# Patient Record
Sex: Male | Born: 1955 | Race: Black or African American | Hispanic: No | Marital: Married | Smoking: Never smoker
Health system: Southern US, Community
[De-identification: ages and names within clinical notes are randomized; demographics above are authoritative.]

## PROBLEM LIST (undated history)

## (undated) DIAGNOSIS — N529 Male erectile dysfunction, unspecified: Secondary | ICD-10-CM

## (undated) DIAGNOSIS — M1990 Unspecified osteoarthritis, unspecified site: Secondary | ICD-10-CM

## (undated) DIAGNOSIS — Z905 Acquired absence of kidney: Secondary | ICD-10-CM

## (undated) DIAGNOSIS — E039 Hypothyroidism, unspecified: Secondary | ICD-10-CM

## (undated) DIAGNOSIS — Z8042 Family history of malignant neoplasm of prostate: Secondary | ICD-10-CM

## (undated) DIAGNOSIS — I10 Essential (primary) hypertension: Secondary | ICD-10-CM

## (undated) DIAGNOSIS — Z972 Presence of dental prosthetic device (complete) (partial): Secondary | ICD-10-CM

## (undated) DIAGNOSIS — Z803 Family history of malignant neoplasm of breast: Secondary | ICD-10-CM

## (undated) DIAGNOSIS — Z9189 Other specified personal risk factors, not elsewhere classified: Secondary | ICD-10-CM

## (undated) DIAGNOSIS — E119 Type 2 diabetes mellitus without complications: Secondary | ICD-10-CM

## (undated) DIAGNOSIS — E785 Hyperlipidemia, unspecified: Secondary | ICD-10-CM

## (undated) DIAGNOSIS — R399 Unspecified symptoms and signs involving the genitourinary system: Secondary | ICD-10-CM

## (undated) DIAGNOSIS — Z923 Personal history of irradiation: Secondary | ICD-10-CM

## (undated) DIAGNOSIS — C61 Malignant neoplasm of prostate: Secondary | ICD-10-CM

## (undated) DIAGNOSIS — Z973 Presence of spectacles and contact lenses: Secondary | ICD-10-CM

## (undated) HISTORY — DX: Family history of malignant neoplasm of breast: Z80.3

## (undated) HISTORY — DX: Family history of malignant neoplasm of prostate: Z80.42

---

## 1997-02-26 HISTORY — PX: KIDNEY DONATION: SHX685

## 2003-09-06 ENCOUNTER — Encounter: Admission: RE | Admit: 2003-09-06 | Discharge: 2003-09-06 | Payer: Self-pay | Admitting: Internal Medicine

## 2004-07-05 ENCOUNTER — Encounter: Admission: RE | Admit: 2004-07-05 | Discharge: 2004-07-05 | Payer: Self-pay | Admitting: Internal Medicine

## 2005-02-26 HISTORY — PX: KNEE ARTHROSCOPY: SUR90

## 2005-11-18 ENCOUNTER — Emergency Department (HOSPITAL_COMMUNITY): Admission: EM | Admit: 2005-11-18 | Discharge: 2005-11-18 | Payer: Self-pay | Admitting: Family Medicine

## 2011-09-24 ENCOUNTER — Encounter (HOSPITAL_COMMUNITY): Payer: Self-pay | Admitting: Emergency Medicine

## 2011-09-24 ENCOUNTER — Emergency Department (HOSPITAL_COMMUNITY): Payer: No Typology Code available for payment source

## 2011-09-24 ENCOUNTER — Emergency Department (HOSPITAL_COMMUNITY)
Admission: EM | Admit: 2011-09-24 | Discharge: 2011-09-24 | Disposition: A | Discharge status: Home / Self Care | Payer: No Typology Code available for payment source | Attending: Emergency Medicine | Admitting: Emergency Medicine

## 2011-09-24 DIAGNOSIS — Z79899 Other long term (current) drug therapy: Secondary | ICD-10-CM | POA: Insufficient documentation

## 2011-09-24 DIAGNOSIS — Y92410 Unspecified street and highway as the place of occurrence of the external cause: Secondary | ICD-10-CM | POA: Insufficient documentation

## 2011-09-24 DIAGNOSIS — M549 Dorsalgia, unspecified: Secondary | ICD-10-CM | POA: Insufficient documentation

## 2011-09-24 DIAGNOSIS — Z8739 Personal history of other diseases of the musculoskeletal system and connective tissue: Secondary | ICD-10-CM | POA: Insufficient documentation

## 2011-09-24 DIAGNOSIS — I10 Essential (primary) hypertension: Secondary | ICD-10-CM | POA: Insufficient documentation

## 2011-09-24 HISTORY — DX: Essential (primary) hypertension: I10

## 2011-09-24 HISTORY — DX: Unspecified osteoarthritis, unspecified site: M19.90

## 2011-09-24 MED ORDER — METHOCARBAMOL 500 MG PO TABS: 500.0000 mg | ORAL_TABLET | Freq: Two times a day (BID) | ORAL | Status: AC
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
## 2012-05-13 HISTORY — PX: PROSTATE BIOPSY: SHX241

## 2012-05-26 ENCOUNTER — Ambulatory Visit
Admission: RE | Admit: 2012-05-26 | Discharge: 2012-05-26 | Disposition: A | Discharge status: Home / Self Care | Payer: BC Managed Care – PPO | Source: Ambulatory Visit | Attending: Radiation Oncology | Admitting: Radiation Oncology

## 2012-05-26 ENCOUNTER — Encounter: Payer: Self-pay | Admitting: Radiation Oncology

## 2012-05-26 VITALS — BP 140/83 | HR 108 | Temp 99.1°F | Wt 232.6 lb

## 2012-05-26 DIAGNOSIS — C61 Malignant neoplasm of prostate: Secondary | ICD-10-CM | POA: Insufficient documentation

## 2012-05-26 DIAGNOSIS — G4730 Sleep apnea, unspecified: Secondary | ICD-10-CM | POA: Insufficient documentation

## 2012-05-26 DIAGNOSIS — Z7982 Long term (current) use of aspirin: Secondary | ICD-10-CM | POA: Insufficient documentation

## 2012-05-26 DIAGNOSIS — I10 Essential (primary) hypertension: Secondary | ICD-10-CM | POA: Insufficient documentation

## 2012-05-26 DIAGNOSIS — N529 Male erectile dysfunction, unspecified: Secondary | ICD-10-CM | POA: Insufficient documentation

## 2012-05-26 DIAGNOSIS — E7800 Pure hypercholesterolemia, unspecified: Secondary | ICD-10-CM | POA: Insufficient documentation

## 2012-05-26 DIAGNOSIS — E119 Type 2 diabetes mellitus without complications: Secondary | ICD-10-CM | POA: Insufficient documentation

## 2012-05-26 DIAGNOSIS — Z79899 Other long term (current) drug therapy: Secondary | ICD-10-CM | POA: Insufficient documentation

## 2012-05-26 HISTORY — DX: Male erectile dysfunction, unspecified: N52.9

## 2012-05-26 HISTORY — DX: Type 2 diabetes mellitus without complications: E11.9

## 2012-05-26 HISTORY — DX: Malignant neoplasm of prostate: C61

## 2012-05-26 NOTE — Progress Notes (Signed)
Keith Maldonado here for consultation for prostate cancer.  He denies pain and fatigue.  He has had trouble emptying his bladder 1-2 times in the past month.  He does have urinary urgency.  He denies hematuria or brining on urination.  He usually gets up 2 times a night to urinate.

## 2012-05-26 NOTE — Progress Notes (Signed)
[facility] Radiation Oncology NEW PATIENT EVALUATION  Name: Keith Maldonado MRN: [id number]  Date:   05/26/2012           DOB: Jun 26, 1955  Status: outpatient   CC: Lorenda Peck, MD  Kathi Ludwig, * , Dr. Heloise Purpura   REFERRING PHYSICIAN: Jethro Bolus I, *   DIAGNOSIS:  Stage TI C. favorable risk adenocarcinoma prostate  HISTORY OF PRESENT ILLNESS:  Keith Maldonado is a 57 y.o. male who is seen today for the courtesy of Dr. Alexis Frock for discussion of possible radiation therapy in the management of his stage TI C. favorable risk adenocarcinoma prostate. He was noted to have an elevated PSA of approximately 3.92, according to the patient, by Dr. Su Hilt late last year. He was seen by Dr. Patsi Sears and a repeat PSA on 03/12/2012 was 3.38. He underwent ultrasound-guided biopsies of the prostate on 05/13/2012. His then have Gleason 6 (3+3) involving less than 5% of one core from the right lateral mid gland, less than 5% of one core from the left lateral base all of high-grade PIN from the left lateral mid gland and left base. His gland volume is 31.58 cc. He is doing well from a GU and GI standpoint. His I PSS score is 10. He does have erectile dysfunction.  PREVIOUS RADIATION THERAPY: No   PAST MEDICAL HISTORY:  has a past medical history of Hypertension; Arthritis; Knee osteoarthritis; Sleep apnea; Diabetes mellitus, type 2; Hypercholesterolemia; Hyperthyroidism; Prostate cancer; Erectile dysfunction; and Elevated PSA (03/12/12).     PAST SURGICAL HISTORY:  Past Surgical History  Procedure Laterality Date  . Knee surgery Left 2008  . Kidney donation    . Prostate biopsy  05/13/2012    right lat mid: adenocarcinoma 3+3=6, left lat base 3+3=6     FAMILY HISTORY: family history includes Diabetes in his mother; Heart failure in his father and mother; and Kidney failure in his father. His father died from kidney failure and cardiac disease and 90.  His mother died from a cardiac event at 46. No family history of prostate cancer.   SOCIAL HISTORY:  reports that he has never smoked. He has never used smokeless tobacco. He reports that  drinks alcohol. He reports that he uses illicit drugs (Marijuana). Married, no children. He is been married for approximately 2 years to his current wife who is a Runner, broadcasting/film/video of approximately 30 years. He works for the [employer].   ALLERGIES: Review of patient's allergies indicates no known allergies.   MEDICATIONS:  Current Outpatient Prescriptions  Medication Sig Dispense Refill  . amitriptyline (ELAVIL) 25 MG tablet Take 75 mg by mouth at bedtime.      Marland Kitchen amLODipine (NORVASC) 10 MG tablet Take 10 mg by mouth daily.      Marland Kitchen aspirin 81 MG tablet Take 81 mg by mouth daily.      . chlorthalidone (HYGROTON) 25 MG tablet Take 25 mg by mouth daily.      Marland Kitchen levothyroxine (SYNTHROID, LEVOTHROID) 125 MCG tablet Take 125 mcg by mouth daily.      . metFORMIN (GLUCOPHAGE) 500 MG tablet Take 500 mg by mouth daily with breakfast.       . rosuvastatin (CRESTOR) 20 MG tablet Take 20 mg by mouth daily.       No current facility-administered medications for this encounter.     REVIEW OF SYSTEMS:  Pertinent items are noted in HPI.    PHYSICAL  EXAM:  weight is 232 lb 9.6 oz (105.507 kg). His temperature is 99.1 F (37.3 C). His blood pressure is 140/83 and his pulse is 108.   Alert and oriented 57 year old African American male appearing his stated age. Head neck examination: Grossly unremarkable. Nodes: Without palpable cervical or supraclavicular lymphadenopathy. Chest: Lungs clear. Heart: Regular in rhythm. Back: Without spinal or CVA tenderness. Abdomen: Without masses organomegaly. Genitalia: Unremarkable to inspection. Rectal: The prostate gland is normal in size and is without focal induration or nodularity. Extremities: Without edema. Neurologic examination: Grossly nonfocal.   LABORATORY  DATA:  No results found for this basename: WBC, HGB, HCT, MCV, PLT   No results found for this basename: NA, K, CL, CO2   No results found for this basename: ALT, AST, GGT, ALKPHOS, BILITOT   PSA 3.38 from 03/12/2012.   IMPRESSION: Stage TI C. favorable risk adenocarcinoma prostate. I explained to the patient and his wife that his prognosis is related to his stage, PSA level, and Gleason score. All are favorable. Other prognostic factors include PSA doubling time and disease volume. He has low disease volume which is favorable. We discussed various management options which include surgery versus close surveillance, versus radiation therapy. Even though he has favorable risk low volume disease, he is relatively young, I would certainly offer him potentially curative therapy. Radiation therapy options include seed implantation alone or 8 weeks of external beam/IMRT. He would be an excellent candidate for seed implantation. We discussed the potential acute and late toxicities of radiation therapy. We discussed radiation safety issues related to seed implantation as well. He'll meet with Dr. Laverle Patter in the near future to discuss his surgical option. I told the patient that if he is in different towards his choice of therapy then he should consider surgery considering his relatively young age. Surgery or radiation therapy would give equivalent excellent outcomes. I gave him my voicemail if he wants to discuss the possibility of radiation therapy.  PLAN: As discussed above.   I spent 60 minutes minutes face to face with the patient and more than 50% of that time was spent in counseling and/or coordination of care.

## 2012-05-26 NOTE — Progress Notes (Signed)
Please see the Nurse Progress Note in the MD Initial Consult Encounter for this patient. 

## 2012-07-22 ENCOUNTER — Telehealth: Payer: Self-pay | Admitting: *Deleted

## 2012-07-22 ENCOUNTER — Encounter: Payer: Self-pay | Admitting: Radiation Oncology

## 2012-07-22 NOTE — Telephone Encounter (Signed)
Called patient to ask question, lvm for a return call 

## 2012-07-22 NOTE — Progress Notes (Signed)
Chart note: Keith Maldonado left a message and wants to proceed with seed implantation. He is no longer interested in surgery. I will have him come in for a CT arch study, then get him scheduled for seed implantation with Dr. Patsi Sears.

## 2012-07-22 NOTE — Telephone Encounter (Signed)
Called patient to inform of pre-seed appt. On 07-29-12 at 10:00 am, lvm for a return call

## 2012-07-25 ENCOUNTER — Telehealth: Payer: Self-pay | Admitting: *Deleted

## 2012-07-25 NOTE — Telephone Encounter (Signed)
Called patient to inform of pre-seed planning CT and implant , spoke with patient and he is aware of these appts. 

## 2012-07-28 ENCOUNTER — Telehealth: Payer: Self-pay | Admitting: *Deleted

## 2012-07-28 NOTE — Telephone Encounter (Signed)
CALLED PATIENT TO REMIND OF APPT. FOR PRE-SEED FOR 07-29-12, SPOKE WITH PATIENT AND HE IS AWARE OF THIS APPT.

## 2012-07-29 ENCOUNTER — Encounter: Payer: Self-pay | Admitting: Radiation Oncology

## 2012-07-29 ENCOUNTER — Ambulatory Visit
Admission: RE | Admit: 2012-07-29 | Discharge: 2012-07-29 | Disposition: A | Discharge status: Home / Self Care | Payer: BC Managed Care – PPO | Source: Ambulatory Visit | Attending: Radiation Oncology | Admitting: Radiation Oncology

## 2012-07-29 DIAGNOSIS — C61 Malignant neoplasm of prostate: Secondary | ICD-10-CM | POA: Insufficient documentation

## 2012-07-29 DIAGNOSIS — Z510 Encounter for antineoplastic radiation therapy: Secondary | ICD-10-CM | POA: Insufficient documentation

## 2012-07-29 NOTE — Progress Notes (Addendum)
Followup note:  CC: Dr. Patsi Sears  Keith Maldonado visits today for review and scheduling of his prostate seed implant with Dr. Patsi Sears. His CT arch study today shows a prostate volume of approximately 35 cc which correlates nicely with Dr. Imelda Pillow measurement of 32 cc. There is no bony arch interference. No new GU or GI difficulties. I discussed the potential acute and late toxicities of radiation therapy, and consent is signed today. His implant is tentatively scheduled for July 17 with Dr. Patsi Sears.  15 minutes was spent face-to-face with the patient, primarily counseling the patient and coordinating his care.

## 2012-07-29 NOTE — Progress Notes (Signed)
   CT simulation/treatment planning note: Keith Maldonado was taken to the CT simulator. His pelvis was scanned. The CT data set was sent to the treatment planning system for contouring of his prostate. The prostate volume was 35 cc, and there is no significant bony arch interference when projecting the prostate over the pubic arch. I'm prescribing 14,500 cGy utilizing I-125 seeds. He is to be implanted with the [employer] system. His tentative implant date is July 17 with Dr. Patsi Sears.

## 2012-07-30 ENCOUNTER — Ambulatory Visit (HOSPITAL_COMMUNITY)
Admission: RE | Admit: 2012-07-30 | Discharge: 2012-07-30 | Disposition: A | Discharge status: Home / Self Care | Payer: BC Managed Care – PPO | Source: Ambulatory Visit | Attending: Urology | Admitting: Urology

## 2012-07-30 ENCOUNTER — Other Ambulatory Visit (HOSPITAL_BASED_OUTPATIENT_CLINIC_OR_DEPARTMENT_OTHER): Payer: Self-pay | Admitting: Urology

## 2012-07-30 DIAGNOSIS — C61 Malignant neoplasm of prostate: Secondary | ICD-10-CM | POA: Insufficient documentation

## 2012-07-30 DIAGNOSIS — Z01818 Encounter for other preprocedural examination: Secondary | ICD-10-CM | POA: Insufficient documentation

## 2012-07-30 DIAGNOSIS — E119 Type 2 diabetes mellitus without complications: Secondary | ICD-10-CM | POA: Insufficient documentation

## 2012-07-30 DIAGNOSIS — I10 Essential (primary) hypertension: Secondary | ICD-10-CM | POA: Insufficient documentation

## 2012-09-03 ENCOUNTER — Telehealth: Payer: Self-pay | Admitting: *Deleted

## 2012-09-03 NOTE — Telephone Encounter (Signed)
CALLED PATIENT TO REMIND OF APPT. FOR 09-04-12, LVM FOR A RETURN CALL

## 2012-09-03 NOTE — Telephone Encounter (Signed)
CALLED PATIENT TO REMIND OF APPT. FOR 09-04-12, SPOKE WITH PATIENT AND HE IS AWARE OF THIS APPT.

## 2012-09-04 ENCOUNTER — Encounter (HOSPITAL_BASED_OUTPATIENT_CLINIC_OR_DEPARTMENT_OTHER)
Admission: RE | Admit: 2012-09-04 | Discharge: 2012-09-04 | Disposition: A | Discharge status: Home / Self Care | Payer: BC Managed Care – PPO | Source: Ambulatory Visit | Attending: Urology | Admitting: Urology

## 2012-09-04 ENCOUNTER — Other Ambulatory Visit: Payer: Self-pay

## 2012-09-04 ENCOUNTER — Encounter (HOSPITAL_BASED_OUTPATIENT_CLINIC_OR_DEPARTMENT_OTHER): Payer: Self-pay | Admitting: *Deleted

## 2012-09-04 LAB — CBC
HCT: 43.4 % (ref 39.0–52.0)
Hemoglobin: 14.4 g/dL (ref 13.0–17.0)
MCHC: 33.2 g/dL (ref 30.0–36.0)
RBC: 6.32 MIL/uL — ABNORMAL HIGH (ref 4.22–5.81)

## 2012-09-04 LAB — COMPREHENSIVE METABOLIC PANEL
ALT: 26 U/L (ref 0–53)
Albumin: 4.2 g/dL (ref 3.5–5.2)
Alkaline Phosphatase: 102 U/L (ref 39–117)
BUN: 20 mg/dL (ref 6–23)
Creatinine, Ser: 1.34 mg/dL (ref 0.50–1.35)
GFR calc non Af Amer: 58 mL/min — ABNORMAL LOW (ref >90)
Sodium: 138 mEq/L (ref 135–145)
Total Bilirubin: 0.2 mg/dL — ABNORMAL LOW (ref 0.3–1.2)

## 2012-09-04 LAB — PROTIME-INR: Prothrombin Time: 11.6 seconds (ref 11.6–15.2)

## 2012-09-04 LAB — APTT: aPTT: 28 seconds (ref 24–37)

## 2012-09-04 NOTE — Progress Notes (Signed)
NPO AFTER MN. ARRIVES AT 0830. CURRENT LAB WORK DONE TODAY AND CXR/EKG IN EPIC AND CHART. WILL TAKE NORVASC AND SYNTHROID AM OF SURG W/ SIPS OF WATER AND DO FLEET ENEMA.

## 2012-09-10 ENCOUNTER — Telehealth: Payer: Self-pay | Admitting: *Deleted

## 2012-09-10 NOTE — Progress Notes (Signed)
Patient contacted by [facility]-will take his potassium today(stated hasn't been)-will recheck potassium in am per order.

## 2012-09-10 NOTE — Telephone Encounter (Signed)
CALLED PATIENT TO REMIND OF PROCEDURE FOR 09-11-12, LVM FOR A RETURN CALL

## 2012-09-10 NOTE — Progress Notes (Signed)
Message left with [facility] regarding potassium 3.0.

## 2012-09-11 ENCOUNTER — Encounter (HOSPITAL_BASED_OUTPATIENT_CLINIC_OR_DEPARTMENT_OTHER): Payer: Self-pay | Admitting: Anesthesiology

## 2012-09-11 ENCOUNTER — Encounter (HOSPITAL_BASED_OUTPATIENT_CLINIC_OR_DEPARTMENT_OTHER)
Admission: RE | Disposition: A | Discharge status: Home / Self Care | Payer: Self-pay | Source: Ambulatory Visit | Attending: Urology

## 2012-09-11 ENCOUNTER — Encounter (HOSPITAL_BASED_OUTPATIENT_CLINIC_OR_DEPARTMENT_OTHER): Payer: Self-pay

## 2012-09-11 ENCOUNTER — Ambulatory Visit (HOSPITAL_BASED_OUTPATIENT_CLINIC_OR_DEPARTMENT_OTHER): Payer: BC Managed Care – PPO | Admitting: Anesthesiology

## 2012-09-11 ENCOUNTER — Ambulatory Visit (HOSPITAL_COMMUNITY): Payer: BC Managed Care – PPO

## 2012-09-11 ENCOUNTER — Encounter: Payer: Self-pay | Admitting: Radiation Oncology

## 2012-09-11 ENCOUNTER — Ambulatory Visit (HOSPITAL_BASED_OUTPATIENT_CLINIC_OR_DEPARTMENT_OTHER)
Admission: RE | Admit: 2012-09-11 | Discharge: 2012-09-11 | Disposition: A | Discharge status: Home / Self Care | Payer: BC Managed Care – PPO | Source: Ambulatory Visit | Attending: Urology | Admitting: Urology

## 2012-09-11 DIAGNOSIS — C61 Malignant neoplasm of prostate: Secondary | ICD-10-CM

## 2012-09-11 DIAGNOSIS — I10 Essential (primary) hypertension: Secondary | ICD-10-CM | POA: Insufficient documentation

## 2012-09-11 DIAGNOSIS — G4730 Sleep apnea, unspecified: Secondary | ICD-10-CM | POA: Insufficient documentation

## 2012-09-11 DIAGNOSIS — E7800 Pure hypercholesterolemia, unspecified: Secondary | ICD-10-CM | POA: Insufficient documentation

## 2012-09-11 DIAGNOSIS — Z01812 Encounter for preprocedural laboratory examination: Secondary | ICD-10-CM | POA: Insufficient documentation

## 2012-09-11 DIAGNOSIS — Z01810 Encounter for preprocedural cardiovascular examination: Secondary | ICD-10-CM | POA: Insufficient documentation

## 2012-09-11 DIAGNOSIS — E119 Type 2 diabetes mellitus without complications: Secondary | ICD-10-CM | POA: Insufficient documentation

## 2012-09-11 DIAGNOSIS — E890 Postprocedural hypothyroidism: Secondary | ICD-10-CM | POA: Insufficient documentation

## 2012-09-11 HISTORY — DX: Hyperlipidemia, unspecified: E78.5

## 2012-09-11 HISTORY — DX: Hypothyroidism, unspecified: E03.9

## 2012-09-11 HISTORY — DX: Personal history of irradiation: Z92.3

## 2012-09-11 HISTORY — PX: RADIOACTIVE SEED IMPLANT: SHX5150

## 2012-09-11 LAB — POCT I-STAT 4, (NA,K, GLUC, HGB,HCT)
Glucose, Bld: 133 mg/dL — ABNORMAL HIGH (ref 70–99)
HCT: 48 % (ref 39.0–52.0)
Sodium: 141 mEq/L (ref 135–145)

## 2012-09-11 LAB — GLUCOSE, CAPILLARY: Glucose-Capillary: 167 mg/dL — ABNORMAL HIGH (ref 70–99)

## 2012-09-11 SURGERY — INSERTION, RADIATION SOURCE, PROSTATE
Anesthesia: General | Site: Prostate | Wound class: Clean

## 2012-09-11 MED ORDER — POTASSIUM CHLORIDE CRYS ER 20 MEQ PO TBCR
40.0000 meq | EXTENDED_RELEASE_TABLET | Freq: Once | ORAL | Status: AC
  Administered 2012-09-11: 40 meq via ORAL
  Filled 2012-09-11: qty 2

## 2012-09-11 MED ORDER — STERILE WATER FOR IRRIGATION IR SOLN
Status: DC | PRN
  Administered 2012-09-11: 3000 mL

## 2012-09-11 MED ORDER — FENTANYL CITRATE 0.05 MG/ML IJ SOLN
25.0000 ug | INTRAMUSCULAR | Status: DC | PRN
  Administered 2012-09-11: 25 ug via INTRAVENOUS
  Filled 2012-09-11: qty 1

## 2012-09-11 MED ORDER — ACETAMINOPHEN 10 MG/ML IV SOLN
INTRAVENOUS | Status: DC | PRN
  Administered 2012-09-11: 1000 mg via INTRAVENOUS

## 2012-09-11 MED ORDER — GLYCOPYRROLATE 0.2 MG/ML IJ SOLN
INTRAMUSCULAR | Status: DC | PRN
  Administered 2012-09-11: 0.2 mg via INTRAVENOUS
  Administered 2012-09-11: 0.6 mg via INTRAVENOUS

## 2012-09-11 MED ORDER — ROCURONIUM BROMIDE 100 MG/10ML IV SOLN
INTRAVENOUS | Status: DC | PRN
  Administered 2012-09-11: 10 mg via INTRAVENOUS
  Administered 2012-09-11: 30 mg via INTRAVENOUS

## 2012-09-11 MED ORDER — CEPHALEXIN 500 MG PO CAPS: 500.0000 mg | ORAL_CAPSULE | Freq: Two times a day (BID) | ORAL | Status: DC

## 2012-09-11 MED ORDER — FLEET ENEMA 7-19 GM/118ML RE ENEM
1.0000 | ENEMA | Freq: Once | RECTAL | Status: DC
  Filled 2012-09-11: qty 1

## 2012-09-11 MED ORDER — CIPROFLOXACIN IN D5W 400 MG/200ML IV SOLN
400.0000 mg | INTRAVENOUS | Status: AC
  Administered 2012-09-11: 400 mg via INTRAVENOUS
  Filled 2012-09-11: qty 200

## 2012-09-11 MED ORDER — NEOSTIGMINE METHYLSULFATE 1 MG/ML IJ SOLN
INTRAMUSCULAR | Status: DC | PRN
  Administered 2012-09-11: 4 mg via INTRAVENOUS

## 2012-09-11 MED ORDER — LACTATED RINGERS IV SOLN
INTRAVENOUS | Status: DC
  Administered 2012-09-11: 09:00:00 via INTRAVENOUS
  Filled 2012-09-11: qty 1000

## 2012-09-11 MED ORDER — LIDOCAINE HCL (CARDIAC) 20 MG/ML IV SOLN
INTRAVENOUS | Status: DC | PRN
  Administered 2012-09-11: 100 mg via INTRAVENOUS

## 2012-09-11 MED ORDER — PHENAZOPYRIDINE HCL 200 MG PO TABS: 200.0000 mg | ORAL_TABLET | Freq: Three times a day (TID) | ORAL | Status: DC | PRN

## 2012-09-11 MED ORDER — HYDROCODONE-IBUPROFEN 5-200 MG PO TABS: 1.0000 | ORAL_TABLET | Freq: Four times a day (QID) | ORAL | Status: DC | PRN

## 2012-09-11 MED ORDER — BELLADONNA ALKALOIDS-OPIUM 16.2-60 MG RE SUPP
RECTAL | Status: DC | PRN
  Administered 2012-09-11: 1 via RECTAL

## 2012-09-11 MED ORDER — SUCCINYLCHOLINE CHLORIDE 20 MG/ML IJ SOLN
INTRAMUSCULAR | Status: DC | PRN
  Administered 2012-09-11: 120 mg via INTRAVENOUS

## 2012-09-11 MED ORDER — TAMSULOSIN HCL 0.4 MG PO CAPS: 0.4000 mg | ORAL_CAPSULE | Freq: Every day | ORAL | Status: DC

## 2012-09-11 MED ORDER — KETOROLAC TROMETHAMINE 30 MG/ML IJ SOLN
INTRAMUSCULAR | Status: DC | PRN
  Administered 2012-09-11: 30 mg via INTRAVENOUS

## 2012-09-11 MED ORDER — PHENAZOPYRIDINE HCL 200 MG PO TABS
200.0000 mg | ORAL_TABLET | Freq: Three times a day (TID) | ORAL | Status: AC
  Administered 2012-09-11: 200 mg via ORAL
  Filled 2012-09-11: qty 1

## 2012-09-11 MED ORDER — DEXAMETHASONE SODIUM PHOSPHATE 4 MG/ML IJ SOLN
INTRAMUSCULAR | Status: DC | PRN
  Administered 2012-09-11: 10 mg via INTRAVENOUS

## 2012-09-11 MED ORDER — LACTATED RINGERS IV SOLN
INTRAVENOUS | Status: DC | PRN
  Administered 2012-09-11 (×2): via INTRAVENOUS

## 2012-09-11 MED ORDER — STERILE WATER FOR IRRIGATION IR SOLN
Status: DC | PRN
  Administered 2012-09-11: 3 mL

## 2012-09-11 MED ORDER — MIDAZOLAM HCL 5 MG/5ML IJ SOLN
INTRAMUSCULAR | Status: DC | PRN
  Administered 2012-09-11: 1 mg via INTRAVENOUS

## 2012-09-11 MED ORDER — ONDANSETRON HCL 4 MG/2ML IJ SOLN
INTRAMUSCULAR | Status: DC | PRN
  Administered 2012-09-11: 4 mg via INTRAVENOUS

## 2012-09-11 MED ORDER — FENTANYL CITRATE 0.05 MG/ML IJ SOLN
INTRAMUSCULAR | Status: DC | PRN
  Administered 2012-09-11: 25 ug via INTRAVENOUS
  Administered 2012-09-11: 50 ug via INTRAVENOUS
  Administered 2012-09-11 (×3): 25 ug via INTRAVENOUS
  Administered 2012-09-11: 50 ug via INTRAVENOUS
  Administered 2012-09-11 (×4): 25 ug via INTRAVENOUS

## 2012-09-11 MED ORDER — PROMETHAZINE HCL 25 MG/ML IJ SOLN
6.2500 mg | INTRAMUSCULAR | Status: DC | PRN
  Filled 2012-09-11: qty 1

## 2012-09-11 MED ORDER — KETOROLAC TROMETHAMINE 30 MG/ML IJ SOLN
15.0000 mg | Freq: Once | INTRAMUSCULAR | Status: DC | PRN
  Filled 2012-09-11: qty 1

## 2012-09-11 MED ORDER — PROPOFOL 10 MG/ML IV BOLUS
INTRAVENOUS | Status: DC | PRN
  Administered 2012-09-11: 50 mg via INTRAVENOUS
  Administered 2012-09-11: 280 mg via INTRAVENOUS

## 2012-09-11 MED ORDER — IOHEXOL 350 MG/ML SOLN
INTRAVENOUS | Status: DC | PRN
  Administered 2012-09-11: 7 mL

## 2012-09-11 SURGICAL SUPPLY — 26 items
BAG URINE DRAINAGE (UROLOGICAL SUPPLIES) ×2 IMPLANT
BLADE SURG ROTATE 9660 (MISCELLANEOUS) ×2 IMPLANT
CATH FOLEY 2WAY SLVR  5CC 16FR (CATHETERS) ×2
CATH FOLEY 2WAY SLVR 5CC 16FR (CATHETERS) ×2 IMPLANT
CATH ROBINSON RED A/P 20FR (CATHETERS) ×2 IMPLANT
CLOTH BEACON ORANGE TIMEOUT ST (SAFETY) ×2 IMPLANT
COVER MAYO STAND STRL (DRAPES) ×2 IMPLANT
COVER TABLE BACK 60X90 (DRAPES) ×2 IMPLANT
DRAPE CAMERA CLOSED 9X96 (DRAPES) ×2 IMPLANT
DRSG TEGADERM 4X4.75 (GAUZE/BANDAGES/DRESSINGS) ×2 IMPLANT
DRSG TEGADERM 8X12 (GAUZE/BANDAGES/DRESSINGS) ×2 IMPLANT
GAUZE SPONGE 4X4 12PLY STRL LF (GAUZE/BANDAGES/DRESSINGS) ×1 IMPLANT
GLOVE BIO SURGEON STRL SZ 6.5 (GLOVE) ×2 IMPLANT
GLOVE BIO SURGEON STRL SZ7.5 (GLOVE) ×9 IMPLANT
GLOVE BIOGEL M STRL SZ7.5 (GLOVE) ×1 IMPLANT
GLOVE BIOGEL PI IND STRL 7.5 (GLOVE) IMPLANT
GLOVE BIOGEL PI INDICATOR 7.5 (GLOVE) ×2
GLOVE ECLIPSE 8.0 STRL XLNG CF (GLOVE) IMPLANT
GOWN PREVENTION PLUS LG XLONG (DISPOSABLE) ×2 IMPLANT
GOWN STRL REIN XL XLG (GOWN DISPOSABLE) ×3 IMPLANT
HOLDER FOLEY CATH W/STRAP (MISCELLANEOUS) ×2 IMPLANT
PACK CYSTOSCOPY (CUSTOM PROCEDURE TRAY) ×2 IMPLANT
SYRINGE 10CC LL (SYRINGE) ×2 IMPLANT
UNDERPAD 30X30 INCONTINENT (UNDERPADS AND DIAPERS) ×4 IMPLANT
WATER STERILE IRR 500ML POUR (IV SOLUTION) ×2 IMPLANT
radioactive seeds ×85 IMPLANT

## 2012-09-11 NOTE — H&P (Signed)
Prostate Cancer   Reason For Visit  Reason for consult: To discuss treatment options for prostate cancer and specifically to consider a robotic prostatectomy. Physician requesting consult: Dr. Jethro Bolus PCP: Dr. Burton Apley   History of Present Illness  [name] is a 57 year old who was noted to have a rising PSA which increased from 2.71 to 3.92 between November 2012 and December 2013. He was seen by Dr. Patsi Sears and his PSA was repeated and was 3.38.  He also had a PCA-3 test performed which was 117 indicating a very high risk for prostate cancer. He underwent a prostate biopsy on 05/13/12 which confirmed Gleason 3+3=6 adenocarcinoma in 2 out of 12 biopsy cores. He has been thoroughly counseled by Dr. Patsi Sears about his treatment options and has seen Dr. Dayton Scrape in radiation oncology as well. He has no family history of prostate cancer.  His medical comorbidities include a history of diabetes managed with metformin, hypertension, hypercholesterolemia, sleep apnea although he does not use CPAP, and hypothyroidism s/p radioactive iodine treatment in the past.   TNM stage: cT1c Nx Mx PSA: 3.38 Gleason score: 3+3=6 Biopsy (05/13/12): 2 out of 12 biopsy cores -- L lateral base (< 5%), R lateral mid (< 5%) Prostate volume: 31.6 cc PSAD: 0.11  Nomogram OC disease: 91% EPE: 7% SVI: 1% LNI: 1.2% PFS (surgery): 98% at 5 years, 98% at 10 years DSS: 99% at 5 years, 99% at 10 years  Urinary function: He has minimal lower urinary tract symptoms. His most troublesome symptom is urinary urgency. IPSS is 9. Erectile function: He has severe erectile dysfunction which has been resistant to therapy with PDE 5 inhibitors. SHIM score: 2.   Past Medical History Problems  1. History of  Adult Sleep Apnea 780.57 2. History of  Diabetes Mellitus 250.00 3. History of  Hypercholesterolemia 272.0 4. History of  Hypertension 401.9 5. History of  Hyperthyroidism 242.90  Surgical  History Problems  1. 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 History of  Knee Surgery Left  Current Meds 1. Amitriptyline HCl 75 MG Oral Tablet; Therapy: (Recorded:15Jan2014) to 2. AmLODIPine Besylate 10 MG Oral Tablet; Therapy: 19Oct2012 to 3. Aspirin 81 MG Oral Tablet; Therapy: (Recorded:15Jan2014) to 4. Crestor 20 MG Oral Tablet; Therapy: 28Nov2012 to 5. MetFORMIN HCl 500 MG Oral Tablet; Therapy: 11Sep2013 to 6. Synthroid TABS; Therapy: (Recorded:29Apr2014) to  Allergies Medication  1. No Known Drug Allergies  Family History Problems  1. Paternal history of  Renal Failure Denied  2. 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  Cardiovascular: no chest pain.  Respiratory: no shortness of breath.    Vitals Vital Signs [Data Includes: Last 1 Day]  29Apr2014 01:18PM  BMI Calculated: 35.31 BSA Calculated: 2.12 Height: 5 ft 7 in Weight: 225 lb  Blood Pressure: 121 / 75 Temperature: 98.4 F Heart Rate: 96  Physical Exam Constitutional: Well  nourished and well developed . No acute distress.  ENT:. The ears and nose are normal in appearance.  Neck: The appearance of the neck is normal and no neck mass is present.  Pulmonary: No respiratory distress, normal respiratory rhythm and effort and clear bilateral breath sounds.  Cardiovascular: Heart  rate and rhythm are normal . No peripheral edema.  Abdomen: left flank incision site(s) well healed. The abdomen is soft and nontender. No masses are palpated. No CVA tenderness. No hernias are palpable. No hepatosplenomegaly noted.  Rectal: Rectal exam demonstrates normal sphincter tone, no tenderness and no masses. Prostate size is estimated to be g. The prostate has no nodularity and is not tender. The left seminal vesicle is nonpalpable. The right seminal vesicle is nonpalpable. The perineum is normal on inspection.  Lymphatics: The femoral and inguinal nodes are not enlarged or tender.  Skin: Normal skin turgor, no visible rash and no visible skin lesions.  Neuro/Psych:. Mood and affect are appropriate.    Results/Data Urine [Data Includes: Last 1 Day]   29Apr2014  COLOR YELLOW   APPEARANCE CLEAR   SPECIFIC GRAVITY 1.020   pH 6.5   GLUCOSE NEG mg/dL  BILIRUBIN NEG   KETONE NEG mg/dL  BLOOD TRACE   PROTEIN NEG mg/dL  UROBILINOGEN 0.2 mg/dL  NITRITE NEG   LEUKOCYTE ESTERASE NEG   SQUAMOUS EPITHELIAL/HPF RARE   WBC 0-2 WBC/hpf  RBC 0-2 RBC/hpf  BACTERIA RARE   CRYSTALS NONE SEEN   CASTS NONE SEEN     I have reviewed his medical records, PSA results, and pathology report. Findings are as dictated above.   Assessment Assessed  1. Prostate Cancer 185  Plan Health Maintenance (V70.0)  1. UA With REFLEX  Done: 29Apr2014 01:10PM Prostate Cancer (185)  2. Follow-up PRN Office  Follow-up  Done: 29Apr2014  Discussion/Summary  1. Prostate cancer:   The patient was counseled about the natural history of prostate cancer and the standard treatment options that are available for prostate cancer. It was explained to him how his age and life expectancy, clinical stage, Gleason score, and PSA affect his prognosis, the decision to proceed with additional staging studies, as well as how that information influences recommended treatment strategies. We discussed the roles for active  surveillance, radiation therapy, surgical therapy, androgen deprivation, as well as ablative therapy options for the treatment of prostate cancer as appropriate to his individual cancer situation. We discussed the risks and benefits of these options with regard to their impact on cancer control and also in terms of potential adverse events, complications, and impact on quiality of life particularly related to urinary, bowel, and sexual function. The patient was encouraged to ask questions throughout the discussion today and all questions were answered to his stated satisfaction. In addition, the patient was provided with and/or directed to appropriate resources and literature for further education about prostate cancer and treatment options.   We discussed surgical therapy for prostate cancer including the different available surgical approaches. We discussed, in detail, the risks and expectations of surgery with regard to cancer control, urinary control, and erectile function as well as the expected postoperative recovery process. Additional risks of surgery including but not limited to bleeding, infection, hernia formation, nerve damage, lymphocele formation, bowel/rectal injury potentially necessitating colostomy, damage to the urinary tract resulting in urine leakage, urethral stricture, and the cardiopulmonary risks such as myocardial infarction, stroke, death, venothromboembolism, etc. were explained. The risk of open surgical conversion for robotic/laparoscopic prostatectomy was also discussed.  He is fairly sure that he wishes to proceed with definitive therapy versus active surveillance. He is most interested in either surgical therapy or brachytherapy. He wishes to further consider his options and will notify me if I can be of further assistance in his care. If he proceeds with surgery, my plan would be to perform a bilateral nerve sparing robotic-assisted laparoscopic radical prostatectomy.  CC: Dr.  Jethro Bolus Dr. Chipper Herb Dr. Burton Apley      Pt has selected brachytherapy as definitive Rx of choice, and this will be accomplished for him. ST

## 2012-09-11 NOTE — Anesthesia Procedure Notes (Addendum)
Procedure Name: LMA Insertion Date/Time: 09/11/2012 10:22 AM Performed by: Jessica Priest Pre-anesthesia Checklist: Patient identified, Emergency Drugs available, Suction available and Patient being monitored Patient Re-evaluated:Patient Re-evaluated prior to inductionOxygen Delivery Method: Circle System Utilized Preoxygenation: Pre-oxygenation with 100% oxygen Intubation Type: IV induction Ventilation: Mask ventilation without difficulty LMA: LMA inserted LMA Size: 4.0 Number of attempts: 1 Airway Equipment and Method: bite block Placement Confirmation: positive ETCO2 Tube secured with: Tape Dental Injury: Teeth and Oropharynx as per pre-operative assessment     Procedure Name: Intubation Date/Time: 09/11/2012 11:20 AM Performed by: Jessica Priest Pre-anesthesia Checklist: Patient identified, Emergency Drugs available, Suction available and Patient being monitored Patient Re-evaluated:Patient Re-evaluated prior to inductionOxygen Delivery Method: Circle System Utilized Preoxygenation: Pre-oxygenation with 100% oxygen Intubation Type: IV induction Ventilation: Mask ventilation without difficulty Laryngoscope Size: Mac and 4 Grade View: Grade II Tube type: Oral Tube size: 8.0 mm Number of attempts: 1 Airway Equipment and Method: stylet Placement Confirmation: ETT inserted through vocal cords under direct vision,  positive ETCO2 and breath sounds checked- equal and bilateral Secured at: 22 cm Tube secured with: Tape Dental Injury: Teeth and Oropharynx as per pre-operative assessment

## 2012-09-11 NOTE — Anesthesia Preprocedure Evaluation (Addendum)
Anesthesia Evaluation  Patient identified by MRN, date of birth, ID band Patient awake    Reviewed: Allergy & Precautions, H&P , NPO status , Patient's Chart, lab work & pertinent test results  Airway Mallampati: II TM Distance: >3 FB Neck ROM: Full    Dental no notable dental hx.    Pulmonary neg pulmonary ROS,  breath sounds clear to auscultation  Pulmonary exam normal       Cardiovascular negative cardio ROS  Rhythm:Regular Rate:Normal     Neuro/Psych negative neurological ROS  negative psych ROS   GI/Hepatic negative GI ROS, Neg liver ROS,   Endo/Other  diabetes, Oral Hypoglycemic AgentsHypothyroidism Morbid obesity  Renal/GU negative Renal ROS  negative genitourinary   Musculoskeletal negative musculoskeletal ROS (+)   Abdominal   Peds negative pediatric ROS (+)  Hematology negative hematology ROS (+)   Anesthesia Other Findings   Reproductive/Obstetrics negative OB ROS                          Anesthesia Physical Anesthesia Plan  ASA: III  Anesthesia Plan: General   Post-op Pain Management:    Induction: Intravenous  Airway Management Planned: LMA  Additional Equipment:   Intra-op Plan:   Post-operative Plan:   Informed Consent: I have reviewed the patients History and Physical, chart, labs and discussed the procedure including the risks, benefits and alternatives for the proposed anesthesia with the patient or authorized representative who has indicated his/her understanding and acceptance.   Dental advisory given  Plan Discussed with: CRNA and Surgeon  Anesthesia Plan Comments:        Anesthesia Quick Evaluation

## 2012-09-11 NOTE — Transfer of Care (Signed)
Immediate Anesthesia Transfer of Care Note  Patient: Keith Maldonado  Procedure(s) Performed: Procedure(s) (LRB): RADIOACTIVE SEED IMPLANT (N/A)  Patient Location: PACU  Anesthesia Type: General  Level of Consciousness: awake, sedated, patient cooperative and responds to stimulation  Airway & Oxygen Therapy: Patient Spontanous Breathing and Patient connected to face mask oxygen  Post-op Assessment: Report given to PACU RN, Post -op Vital signs reviewed and stable and Patient moving all extremities  Post vital signs: Reviewed and stable  Complications: No apparent anesthesia complications

## 2012-09-11 NOTE — Progress Notes (Signed)
[facility] Radiation Oncology Brachytherapy Operative Procedure Note  Name: Keith Maldonado MRN: [id number]  Date:   07/24/2012           DOB: 07-Nov-1955  Status:outpatient    WU:[id number], Vernie Ammons, MD  Dr. 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  PROCEDURE: Insertion of radioactive I-125 seeds into the prostate gland.  RADIATION DOSE:  145  Gy, definitive therapy.  TECHNIQUE: Keith Maldonado was brought to the operating room with Dr. Patsi Sears. He was placed in the dorsolithotomy position. He was catheterized and a rectal tube was inserted. The perineum was shaved, prepped and draped. The ultrasound probe was then introduced into the rectum to see the prostate gland.  TREATMENT DEVICE: A needle grid was attached to the ultrasound probe stand and anchor needles were placed.  COMPLEX ISODOSE CALCULATION: The prostate was imaged in 3D using a sagittal sweep of the prostate probe. These images were transferred to the planning computer. There, the prostate, urethra and rectum were defined on each axial reconstructed image. Then, the software created an optimized plan and a few seed positions were adjusted. Then the accepted plan was uploaded to the seed Selectron afterloading unit.  SPECIAL TREATMENT PROCEDURE/SUPERVISION AND HANDLING: The Nucletron FIRST system was used to place the needles under sagittal guidance. A total of 28 needles were used to deposit 85 seeds in the prostate gland. The individual seed activity was 0.39 mCi for a total implant activity of 33.4 mCi.  COMPLEX SIMULATION: At the end of the procedure, an anterior radiograph of the pelvis was obtained to document seed positioning and count. Cystoscopy was performed to check the urethra and bladder. One seed was retrieved from the bladder.  MICRODOSIMETRY: At the end of the procedure, the patient was  emitting 0.06 mrem/hr at 1 meter. Accordingly, he was considered safe for hospital discharge.  PLAN: The patient will return to the radiation oncology clinic for post implant CT dosimetry in three weeks.

## 2012-09-11 NOTE — Interval H&P Note (Signed)
History and Physical Interval Note:  09/11/2012 8:48 AM  Keith Maldonado  has presented today for surgery, with the diagnosis of PROSTATE CANCER   The various methods of treatment have been discussed with the patient and family. After consideration of risks, benefits and other options for treatment, the patient has consented to  Procedure(s): RADIOACTIVE SEED IMPLANT (N/A) as a surgical intervention .  The patient's history has been reviewed, patient examined, no change in status, stable for surgery.  I have reviewed the patient's chart and labs.  Questions were answered to the patient's satisfaction.     Jethro Bolus I

## 2012-09-11 NOTE — Op Note (Signed)
Pre-operative diagnosis :  Multifocal T1c Adenocarcinoma Prostate  Postoperative diagnosis: Same  Operation:  Intraoperative placement of iodine 125 seed implant within the prostate(84 seeds).  Surgeon:  Kathie Rhodes. Patsi Sears, MD  Radiation therapist:   Chipper Herb M.D.  Anesthesia:  General endotracheal with paralysis  Preparation:  After appropriate preanesthesia, the patient was brought to the operating room, placed upon the operating table in dorsal supine position where general LMA anesthesia was introduced. However, because of the patient's bili breathing, because of his large physical size, LMA anesthesia the did not work well. There've he was therefore changed to general endotracheal anesthesia, with paralysis, so that he would not move for the procedure. Armband was double checked. History was double checked.  Review history:  History of Present Illness: y of Present Illness  57 yo Type 2 diabetic male returns today for consultation to review pathology after prostate biopsy on 05/13/12 for hx of elevated PSA. Originally referred back by Dr. Su Hilt for further evaluation of a rising PSA. No hx trauma. 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  Note hx of ED, failing injections. He was counselled re: prostheses, but did not want to pursue. (2008).  03/12/12 labs: PSA - 3.38 and PCA 3 - positive  01/29/12 PSA - 3.92  01/24/11 PSA - 2.71     Statement of  Likelihood of Success: Excellent. TIME-OUT observed.:  Procedure:  The patient underwent implantation of I-125 seeds in the prostate, with 30 needles, and 85 cc. Cystoscopy was accomplished, with the finding of one seed within the urethra at the Vero on the right side. This was manipulated the bladder, and irrigated free from the bladder. This left a total of 84 active seeds within the prostate. Foley catheter was placed in the bladder to straight drainage, and sterile dressing was applied. The patient was given IV Toradol prior to awakening. He  was awakened, and taken to recovery room in good condition.

## 2012-09-11 NOTE — Consult Note (Signed)
Urology Consult  Referring physician:    Reason for referral:  prostate cancer  History of Present Illness:   y of Present Illness      57 yo Type 2 diabetic male returns today for consultation to review pathology after prostate biopsy on 05/13/12 for hx of elevated PSA.  Originally referred back by Dr. Su Hilt for further evaluation of a rising PSA. No hx trauma. 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      Note hx of ED, failing injections. He was counselled re: prostheses, but did not want to pursue. (2008).   03/12/12 labs:  PSA - 3.38 and PCA 3 - positive 01/29/12  PSA - 3.92 01/24/11  PSA - 2.71      Past Medical History Problems  1. History of  Adult Sleep Apnea 780.57 2. History of  Diabetes Mellitus 250.00 3. History of  Hypercholesterolemia 272.0 4. History of  Hypertension 401.9 5. History of  Hyperthyroidism 242.90  Surgical History Problems  1. 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 History of  Knee Surgery Left  Current Meds 1. Amitriptyline HCl 75 MG Oral Tablet; Therapy: (Recorded:15Jan2014) to 2. AmLODIPine Besylate 10 MG Oral Tablet; Therapy: 19Oct2012 to 3. Aspirin 81 MG Oral Tablet; Therapy: (Recorded:15Jan2014) to 4. Crestor 20 MG Oral Tablet; Therapy: 28Nov2012 to 5. MetFORMIN HCl 500 MG Oral Tablet; Therapy: 11Sep2013 to  Allergies Medication  1. No Known Drug Allergies  Family History Problems  1. Paternal history of  Renal Failure Denied  2. Family history of  Family Health Status Number Of Children  Social History Problems    Caffeine Use   Paternal history of  Death In The Family Father   Paternal history of  Death In The Family Mother   Marital History - Currently Married   History of  Marital History - Single   Never A Smoker   Occupation: Denied    History of  Alcohol Use   History of  Tobacco Use  Review of Systems Genitourinary, constitutional, skin, eye, otolaryngeal, hematologic/lymphatic, cardiovascular,  pulmonary, endocrine, musculoskeletal, gastrointestinal, neurological and psychiatric system(s) were reviewed and pertinent findings if present are noted.  Genitourinary: feelings of urinary urgency, nocturia and erectile dysfunction.    Vitals Vital Signs [Data Includes: Last 1 Day]  26Mar2014 09:25AM  Blood Pressure: 151 / 95 Temperature: 98 F Heart Rate: 99  Physical Exam Rectal: Rectal exam demonstrates normal sphincter tone, the anus is normal on inspection., no tenderness, no masses and no residual hemorrhoidal skin tags seen. Estimated prostate size is 2+. Normal rectal tone, no rectal masses, prostate is smooth, symmetric and non-tender. The prostate has no nodularity, is not indurated and is not fluctuant. The left seminal vesicle is nonpalpable. The right seminal vesicle is nonpalpable. The perineum is normal on inspection.  Genitourinary: Examination of the penis demonstrates no discharge, no masses, no adherence of the prepuce, no phimosis, no paraphimosis, no balanitis, no priapism, no lesions and a normal meatus. The penis is uncircumcised. The scrotum is normal in appearance and without lesions. The right vas deferens is not able to be palpated. The left vas deferens is not able to be palpated. The right epididymis is palpably normal and non-tender. The left epididymis is palpably normal and non-tender. The right testis is enlarged, but palpably normal, non-tender and without masses. The left testis is enlarged, but normal, non-tender and without masses. Normal foreskin.    Assessment Assessed  1. Prostate Cancer 185 2. PSA,Elevated 790.93 3. 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 He has multiple areas of pre-cancerous tissue also ( 3 HgPIN, and 1 atypical).    We have discussed his cancer in terms of his age, as well  as his co-morbidiies ( diabetes and ED). He understands that his relatively young age means that he could have "any" treatment needed for his cancer-in view of the fact that he has > 15 yr life expectancy; but that his age also confers a more aggressive prostate cancer, and more years to develop complications from various therapies.    WE have discussed the diagnosis of CaP, including grading and staging of the disease. We have discussed the [employer] nomogram for [employer]' database, and  also the [employer]. He has a 90% chance of organ confined disease, and 10% chance of capsule penetration, while only a 1% chance of seminal vesical invasion and 1% chance of lymphatic invasion. His [employer] data confirms the above data, while noting a 26% chance of INDOLENT cancer, and a 98% chance of progression free probability after RRP, and 93% chance after brachytherapy.   We have begun to discuss alternative treatment options, including (1) no treatment; (2) watchful waiting Rx with psa q 3-4 months and repeat bx in 12-18 months; (3) delayed hormone Rx-rescue Rx when the disease develops bone mets; and treatments for "cure" including LRRP; (2) I-125 seed Rx, with or without EBRT/hormone boost; (3) cryotherapy; (4) hormone hreapy; (5) chemoterapy; (6) new therapies; (7) research therapies.    He is not a good candidate for focal therapy because of his multifocality of disease, and pre-cancerous esions, but may be a candidate for newer lab test to determine bilogic potential of the disease, prior to determining his tretment options. He will have a 2nd opinion with Dr. Laverle Patter regardint this as well as LRRP; and with Dr. Dayton Scrape regarding Radiation therapy options.   Plan  PSA,Elevated (790.93), Prostate Cancer (185)  1. Follow-up After Test Office  Follow-up  Requested for: 26Mar2014     1. 2nd opinion Dr. Laverle Patter. Consider new test for prostate cancer aggressiveness).  2. 2nd opinion Dr. Dayton Scrape    Radiation Oncology Referral Referral  Referral  Status: Hold For - Appointment,Records  Requested for: 26Mar2014 Ordered ASAP; For: PSA,Elevated (790.93); Ordered By: Jethro Bolus  Performed:   Order Comments: Dr. Karie Schwalbe needs to sign off  Due: 27Mar2014 Marked Important; Last Updated By: Ysidro Evert Urology Referral Referral  Referral  Status: Hold For - Appointment,Records  Requested for: 26Mar2014 Ordered ASAP; For: PSA,Elevated (790.93); Ordered By: Jethro Bolus  Performed:   Order Comments: not outside appt, task to [facility]. Linda aware to [employer]  Due: 27Mar2014 Marked Important; Last Updated By: Ileene Musa Electronically signed by : Jethro Bolus, M.D.; May 21 2012  2:00PM   Past Medical History  Diagnosis Date  . Hypertension   . Diabetes mellitus, type 2   . Erectile dysfunction   . Hypothyroidism   . H/O radioactive iodine thyroid ablation   . Hyperlipidemia   . Prostate cancer     prostate volume 31.58 cc  . Arthritis     knees   Past Surgical History  Procedure Laterality Date  . Prostate biopsy  05/13/2012    right lat mid: adenocarcinoma 3+3=6, left  lat base 3+3=6  . Knee arthroscopy Left 2007  . Kidney donation Left 1999    Medications: I have reviewed the patient's current medications.  Allergies: No Known Allergies  Family History  Problem Relation Age of Onset  . Diabetes Mother   . Heart failure Mother   . Heart failure Father   . Kidney failure Father     Social History:  reports that he has never smoked. He has never used smokeless tobacco. He reports that  drinks alcohol. He reports that he uses illicit drugs (Marijuana).  @ROS @  Physical Exam:  Vital signs in last 24 hours: Weight:  [101.747 kg (224 lb 5 oz)] 101.747 kg (224 lb 5 oz) (07/17 0841) @PHYSEXAMBYAGE2 @  Laboratory Data:  No results found for this or any previous visit (from the past 72 hour(s)). No results found for this or any previous  visit (from the past 240 hour(s)). Creatinine:  Recent Labs  09/04/12 1305  CREATININE 1.34     Impression/Assessment:    CaP  Plan:    I125 seed Rx  Bernece Gall I 09/11/2012, 8:55 AM

## 2012-09-11 NOTE — Progress Notes (Addendum)
[facility] Radiation Oncology End of Treatment Note  Name:Keith Maldonado  Date: 09/11/2012 ZOX:[id number] DOB:09-17-1955   Status:outpatient    CC: Lorenda Peck, MD  Dr. Jethro Bolus  REFERRING PHYSICIAN: Dr. Jethro Bolus   DIAGNOSIS: Stage TI C. favorable risk adenocarcinoma prostate   INDICATION FOR TREATMENT: Curative   TREATMENT DATES: Date of implant 09/11/2012                          SITE/DOSE: Prostate 145Gy utilizing iodine 125 seeds, delivering 85 seeds and 28 active needles. Individual seed activity 0.39 mCi per seed for a total implant activity of 33.4 mCi. Of note is that one seed was extracted from the bladder at the time of cystoscopy following his implant for net total of 84 seeds implanted.                                        NARRATIVE:    The patient appears to have undergone a successful Nucletron seed Selectron implant.                        PLAN: Routine followup in 3 weeks. At that time we'll obtain a CT scan for his post implant dosimetry. Patient instructed to call if questions or worsening complaints in interim.

## 2012-09-11 NOTE — Anesthesia Postprocedure Evaluation (Signed)
Anesthesia Post Note  Patient: Keith Maldonado  Procedure(s) Performed: Procedure(s) (LRB): RADIOACTIVE SEED IMPLANT (N/A)  Anesthesia type: General  Patient location: PACU  Post pain: Pain level controlled  Post assessment: Post-op Vital signs reviewed  Last Vitals: BP 129/69  Pulse 70  Temp(Src) 36.5 C (Oral)  Resp 9  Ht 5' 6.5" (1.689 m)  Wt 224 lb 5 oz (101.747 kg)  BMI 35.67 kg/m2  SpO2 100%  Post vital signs: Reviewed  Level of consciousness: sedated  Complications: No apparent anesthesia complications

## 2012-09-12 ENCOUNTER — Encounter (HOSPITAL_BASED_OUTPATIENT_CLINIC_OR_DEPARTMENT_OTHER): Payer: Self-pay | Admitting: Urology

## 2012-09-25 ENCOUNTER — Telehealth: Payer: Self-pay | Admitting: *Deleted

## 2012-09-25 NOTE — Telephone Encounter (Signed)
CALLED PATIENT TO REMIND OF APPTS. FOR 09-30-12, SPOKE WITH PATIENT AND HE IS AWARE OF THESE APPTS.

## 2012-09-30 ENCOUNTER — Encounter: Payer: Self-pay | Admitting: Radiation Oncology

## 2012-09-30 ENCOUNTER — Ambulatory Visit
Admission: RE | Admit: 2012-09-30 | Discharge: 2012-09-30 | Disposition: A | Discharge status: Home / Self Care | Payer: BC Managed Care – PPO | Source: Ambulatory Visit | Attending: Radiation Oncology | Admitting: Radiation Oncology

## 2012-09-30 VITALS — BP 137/85 | HR 108 | Temp 98.6°F | Resp 20

## 2012-09-30 DIAGNOSIS — C61 Malignant neoplasm of prostate: Secondary | ICD-10-CM

## 2012-09-30 NOTE — Progress Notes (Signed)
Pt states he has frequent urination, burning, stream starts, stops. Pt stopped taking Pyridium a few days after his implant. Advised he may want to restart to help w/burning. Pt reports nocturia x 2. He states he has more frequent bm's but feels it may be due to eating lots of watermelon and cherries. Pt states his symptoms have been slowly improving since implant date. He states he had catheter x 4 days s/p implant.

## 2012-09-30 NOTE — Progress Notes (Signed)
Complex CT simulation note: The patient was taken to the CT simulator. His pelvis was scanned. The CT data set was sent to the [facility] system for contouring of his prostate and rectum. We'll then perform his post implant dosimetry to assess the quality of his implant.

## 2012-09-30 NOTE — Progress Notes (Signed)
CC: Dr. Jethro Bolus  Followup note:  Keith Maldonado visits today approximately 3 weeks following his prostate seed implant with Dr. Patsi Sears in the management of his stage TI C. favorable risk adenocarcinoma prostate. His implant was on Friday Following his implant he had dysuria which improved with Pyridium. He discontinued his Pyridium, he still has some degree of dysuria. He does have some urinary frequency which is slowly improving. He has occasional urinary hesitancy. No fever. There is no odor  to his urine and his urine is clear. No GI/rectal symptoms. He sees Dr. Patsi Sears for a followup visit this Thursday.  His CT scan for his post implant dosimetry shows what appears to be an excellent seed distribution from the base to the apex.  Physical examination: Alert and oriented. Filed Vitals:   09/30/12 1455  BP: 137/85  Pulse: 108  Temp: 98.6 F (37 C)  Resp: 20   Rectal examination not performed today.  Impression: Radiation urethritis as expected.  Plan: I told the patient to resume his Pyridium. He'll see Dr. Patsi Sears this Thursday and may have a urine culture that time just to make sure that he does not have a UTI. We'll move ahead with his post implant dosimetry which is expected to be quite favorable. This will be forwarded to Dr. Patsi Sears with the next few weeks. Dr. Patsi Sears may obtain a PSA in a few months. I ask that Dr. Patsi Sears keep me posted on his progress.

## 2012-10-01 ENCOUNTER — Encounter: Payer: Self-pay | Admitting: Radiation Oncology

## 2012-10-01 NOTE — Progress Notes (Signed)
CC: Dr. Jethro Bolus, Dr. Burton Apley  Post implant CT  Dosimetry/3-D simulation note: The patient completed his post implant CT dosimetry/3-D simulation to assess the quality of his prostate seed implant. His intraoperative prostate volume by ultrasound was 43.6 cc and his postoperative prostate volume by CT was 40.9 cc, a close correlation. Dose volume histograms were obtained for the prostate and rectum. His prostate D 90 is 128.2% and V100 98.6%, both excellent. Only 0.11 cc of rectum received the prescribed dose of 14,500 cGy. In summary, the patient has excellent post implant dosimetry with a low risk for late rectal toxicity.

## 2013-07-22 ENCOUNTER — Encounter: Payer: Self-pay | Admitting: Podiatrist

## 2013-07-22 ENCOUNTER — Ambulatory Visit (INDEPENDENT_AMBULATORY_CARE_PROVIDER_SITE_OTHER): Payer: BC Managed Care – PPO | Admitting: Podiatrist

## 2013-07-22 VITALS — BP 165/98 | HR 78 | Resp 14 | Ht 66.5 in | Wt 230.0 lb

## 2013-07-22 DIAGNOSIS — B351 Tinea unguium: Secondary | ICD-10-CM

## 2013-07-22 DIAGNOSIS — M79609 Pain in unspecified limb: Secondary | ICD-10-CM

## 2013-07-22 MED ORDER — NAFTIFINE HCL 2 % EX GEL: 1.0000 "application " | Freq: Every day | CUTANEOUS | Status: DC

## 2013-07-22 NOTE — Progress Notes (Signed)
   Subjective:    Patient ID: Keith Maldonado, male    DOB: [birth year], 58 y.o.   MRN: [id number]  HPI Comments: Pt presents for debridement of 1 - 10 toenails, and a diabetic foot exam.  Pt states he gets a itchy rash between his right 3, 4th toes during the summer.     Review of Systems  Genitourinary: Positive for urgency and frequency.  Musculoskeletal: Positive for arthralgias.  All other systems reviewed and are negative.      Objective:   Physical Exam  GENERAL APPEARANCE: Alert, conversant. Appropriately groomed. No acute distress.  VASCULAR: Pedal pulses palpable at 2/4 DP and PT bilateral.  Capillary refill time is immediate to all digits,  Proximal to distal cooling it warm to warm.  Digital hair growth is present bilateral  NEUROLOGIC: sensation is intact epicritically and protectively to 5.07 monofilament at 5/5 sites bilateral.  Light touch is intact bilateral, vibratory sensation intact bilateral, achilles tendon reflex is intact bilateral.  MUSCULOSKELETAL: acceptable muscle strength, tone and stability bilateral.  Intrinsic muscluature intact bilateral.  Rectus appearance of foot and digits noted bilateral.   DERMATOLOGIC: skin color, texture, and turger are within normal limits.  No preulcerative lesions are seen, no interdigital maceration noted.  No open lesions present.  Digital nails are thick, discolored, distrophic, painful and mycotic x 10.         Assessment & Plan:  Symptomatic mycotic toenails  Plan:  Discussed etiology, pathology, conservative vs. Surgical therapies and at this time debridement of symptomatic toenails was recommended.  Onychoreduction of symptomatic toenails was performed without iatrogenic incident.  Patient was instructed on signs and symptoms of infection and was told to call immediately should any of these arise.

## 2013-07-22 NOTE — Patient Instructions (Signed)
Diabetes and Foot Care Diabetes may cause you to have problems because of poor blood supply (circulation) to your feet and legs. This may cause the skin on your feet to become thinner, break easier, and heal more slowly. Your skin may become dry, and the skin may peel and crack. You may also have nerve damage in your legs and feet causing decreased feeling in them. You may not notice minor injuries to your feet that could lead to infections or more serious problems. Taking care of your feet is one of the most important things you can do for yourself.  HOME CARE INSTRUCTIONS  Wear shoes at all times, even in the house. Do not go barefoot. Bare feet are easily injured.  Check your feet daily for blisters, cuts, and redness. If you cannot see the bottom of your feet, use a mirror or ask someone for help.  Wash your feet with warm water (do not use hot water) and mild soap. Then pat your feet and the areas between your toes until they are completely dry. Do not soak your feet as this can dry your skin.  Apply a moisturizing lotion or petroleum jelly (that does not contain alcohol and is unscented) to the skin on your feet and to dry, brittle toenails. Do not apply lotion between your toes.  Trim your toenails straight across. Do not dig under them or around the cuticle. File the edges of your nails with an emery board or nail file.  Do not cut corns or calluses or try to remove them with medicine.  Wear clean socks or stockings every day. Make sure they are not too tight. Do not wear knee-high stockings since they may decrease blood flow to your legs.  Wear shoes that fit properly and have enough cushioning. To break in new shoes, wear them for just a few hours a day. This prevents you from injuring your feet. Always look in your shoes before you put them on to be sure there are no objects inside.  Do not cross your legs. This may decrease the blood flow to your feet.  If you find a minor scrape,  cut, or break in the skin on your feet, keep it and the skin around it clean and dry. These areas may be cleansed with mild soap and water. Do not cleanse the area with peroxide, alcohol, or iodine.  When you remove an adhesive bandage, be sure not to damage the skin around it.  If you have a wound, look at it several times a day to make sure it is healing.  Do not use heating pads or hot water bottles. They may burn your skin. If you have lost feeling in your feet or legs, you may not know it is happening until it is too late.  Make sure your health care provider performs a complete foot exam at least annually or more often if you have foot problems. Report any cuts, sores, or bruises to your health care provider immediately. SEEK MEDICAL CARE IF:   You have an injury that is not healing.  You have cuts or breaks in the skin.  You have an ingrown nail.  You notice redness on your legs or feet.  You feel burning or tingling in your legs or feet.  You have pain or cramps in your legs and feet.  Your legs or feet are numb.  Your feet always feel cold. SEEK IMMEDIATE MEDICAL CARE IF:   There is increasing redness,   swelling, or pain in or around a wound.  There is a red line that goes up your leg.  Pus is coming from a wound.  You develop a fever or as directed by your health care provider.  You notice a bad smell coming from an ulcer or wound. Document Released: 02/10/2000 Document Revised: 10/15/2012 Document Reviewed: 07/22/2012 [employer] Patient Information 2014 [employer].  

## 2014-03-30 ENCOUNTER — Other Ambulatory Visit: Payer: Self-pay | Admitting: Urology

## 2014-06-14 ENCOUNTER — Encounter (HOSPITAL_BASED_OUTPATIENT_CLINIC_OR_DEPARTMENT_OTHER): Payer: Self-pay | Admitting: *Deleted

## 2014-06-14 NOTE — Progress Notes (Signed)
NPO AFTER  MN. ARRIVE AT 0715. NEEDS ISTAT AND EKG. WILL TAKE COREG, NORVASC, AND SYNTHROID AM DOS W/ SIPS OF WATER.

## 2014-06-14 NOTE — Progress Notes (Signed)
   06/14/14 1031  OBSTRUCTIVE SLEEP APNEA  Have you ever been diagnosed with sleep apnea through a sleep study? No  Do you snore loudly (loud enough to be heard through closed doors)?  1  Do you often feel tired, fatigued, or sleepy during the daytime? 0  Has anyone observed you stop breathing during your sleep? 1  Do you have, or are you being treated for high blood pressure? 1  BMI more than 35 kg/m2? 1  Age over 58 years old? 1  Neck circumference greater than 40 cm/16 inches? 1  Gender: 1  Obstructive Sleep Apnea Score 7

## 2014-06-17 ENCOUNTER — Encounter (HOSPITAL_BASED_OUTPATIENT_CLINIC_OR_DEPARTMENT_OTHER): Payer: Self-pay

## 2014-06-17 ENCOUNTER — Ambulatory Visit (HOSPITAL_BASED_OUTPATIENT_CLINIC_OR_DEPARTMENT_OTHER): Payer: BC Managed Care – PPO | Admitting: Anesthesiology

## 2014-06-17 ENCOUNTER — Encounter (HOSPITAL_BASED_OUTPATIENT_CLINIC_OR_DEPARTMENT_OTHER)
Admission: RE | Disposition: A | Discharge status: Home / Self Care | Payer: Self-pay | Source: Ambulatory Visit | Attending: Urology

## 2014-06-17 ENCOUNTER — Ambulatory Visit (HOSPITAL_BASED_OUTPATIENT_CLINIC_OR_DEPARTMENT_OTHER)
Admission: RE | Admit: 2014-06-17 | Discharge: 2014-06-17 | Disposition: A | Discharge status: Home / Self Care | Payer: BC Managed Care – PPO | Source: Ambulatory Visit | Attending: Urology | Admitting: Urology

## 2014-06-17 DIAGNOSIS — E119 Type 2 diabetes mellitus without complications: Secondary | ICD-10-CM | POA: Insufficient documentation

## 2014-06-17 DIAGNOSIS — E780 Pure hypercholesterolemia: Secondary | ICD-10-CM | POA: Diagnosis not present

## 2014-06-17 DIAGNOSIS — M1990 Unspecified osteoarthritis, unspecified site: Secondary | ICD-10-CM | POA: Diagnosis not present

## 2014-06-17 DIAGNOSIS — R972 Elevated prostate specific antigen [PSA]: Secondary | ICD-10-CM | POA: Insufficient documentation

## 2014-06-17 DIAGNOSIS — E890 Postprocedural hypothyroidism: Secondary | ICD-10-CM | POA: Diagnosis not present

## 2014-06-17 DIAGNOSIS — I10 Essential (primary) hypertension: Secondary | ICD-10-CM | POA: Insufficient documentation

## 2014-06-17 DIAGNOSIS — Z7982 Long term (current) use of aspirin: Secondary | ICD-10-CM | POA: Insufficient documentation

## 2014-06-17 DIAGNOSIS — G4730 Sleep apnea, unspecified: Secondary | ICD-10-CM | POA: Diagnosis not present

## 2014-06-17 DIAGNOSIS — E785 Hyperlipidemia, unspecified: Secondary | ICD-10-CM | POA: Insufficient documentation

## 2014-06-17 DIAGNOSIS — C61 Malignant neoplasm of prostate: Secondary | ICD-10-CM | POA: Insufficient documentation

## 2014-06-17 DIAGNOSIS — Z6837 Body mass index (BMI) 37.0-37.9, adult: Secondary | ICD-10-CM | POA: Diagnosis not present

## 2014-06-17 HISTORY — DX: Acquired absence of kidney: Z90.5

## 2014-06-17 HISTORY — DX: Other specified personal risk factors, not elsewhere classified: Z91.89

## 2014-06-17 HISTORY — PX: CRYOABLATION: SHX1415

## 2014-06-17 HISTORY — PX: CYSTOSCOPY: SHX5120

## 2014-06-17 HISTORY — DX: Presence of dental prosthetic device (complete) (partial): Z97.2

## 2014-06-17 HISTORY — DX: Unspecified symptoms and signs involving the genitourinary system: R39.9

## 2014-06-17 HISTORY — DX: Presence of spectacles and contact lenses: Z97.3

## 2014-06-17 LAB — POCT I-STAT 4, (NA,K, GLUC, HGB,HCT)
Glucose, Bld: 153 mg/dL — ABNORMAL HIGH (ref 70–99)
HCT: 46 % (ref 39.0–52.0)
Hemoglobin: 15.6 g/dL (ref 13.0–17.0)
Potassium: 2.9 mmol/L — ABNORMAL LOW (ref 3.5–5.1)
Sodium: 139 mmol/L (ref 135–145)

## 2014-06-17 LAB — GLUCOSE, CAPILLARY: Glucose-Capillary: 146 mg/dL — ABNORMAL HIGH (ref 70–99)

## 2014-06-17 SURGERY — CRYOABLATION, PROSTATE
Anesthesia: General | Site: Prostate

## 2014-06-17 MED ORDER — CIPROFLOXACIN HCL 500 MG PO TABS: 500.0000 mg | ORAL_TABLET | Freq: Two times a day (BID) | ORAL | Status: DC

## 2014-06-17 MED ORDER — BELLADONNA ALKALOIDS-OPIUM 16.2-60 MG RE SUPP
RECTAL | Status: DC | PRN
  Administered 2014-06-17: 1 via RECTAL

## 2014-06-17 MED ORDER — LACTATED RINGERS IV SOLN
INTRAVENOUS | Status: DC
  Filled 2014-06-17: qty 1000

## 2014-06-17 MED ORDER — TRAMADOL-ACETAMINOPHEN 37.5-325 MG PO TABS: 1.0000 | ORAL_TABLET | Freq: Four times a day (QID) | ORAL | Status: AC | PRN

## 2014-06-17 MED ORDER — PHENYLEPHRINE HCL 10 MG/ML IJ SOLN
10.0000 mg | INTRAVENOUS | Status: DC | PRN
  Administered 2014-06-17: 25 ug/min via INTRAVENOUS

## 2014-06-17 MED ORDER — MIDAZOLAM HCL 5 MG/5ML IJ SOLN
INTRAMUSCULAR | Status: DC | PRN
  Administered 2014-06-17: 2 mg via INTRAVENOUS

## 2014-06-17 MED ORDER — PROPOFOL INFUSION 10 MG/ML OPTIME
INTRAVENOUS | Status: DC | PRN
  Administered 2014-06-17: 200 mL via INTRAVENOUS

## 2014-06-17 MED ORDER — ONDANSETRON HCL 4 MG/2ML IJ SOLN
INTRAMUSCULAR | Status: DC | PRN
  Administered 2014-06-17: 4 mg via INTRAVENOUS

## 2014-06-17 MED ORDER — MIDAZOLAM HCL 2 MG/2ML IJ SOLN
INTRAMUSCULAR | Status: AC
  Filled 2014-06-17: qty 2

## 2014-06-17 MED ORDER — OXYCODONE HCL 5 MG PO TABS
10.0000 mg | ORAL_TABLET | ORAL | Status: DC | PRN
  Administered 2014-06-17: 10 mg via ORAL
  Filled 2014-06-17: qty 2

## 2014-06-17 MED ORDER — EPHEDRINE SULFATE 50 MG/ML IJ SOLN
INTRAMUSCULAR | Status: DC | PRN
  Administered 2014-06-17: 10 mg via INTRAVENOUS
  Administered 2014-06-17: 15 mg via INTRAVENOUS

## 2014-06-17 MED ORDER — KETOROLAC TROMETHAMINE 30 MG/ML IJ SOLN
INTRAMUSCULAR | Status: DC | PRN
  Administered 2014-06-17: 30 mg via INTRAVENOUS

## 2014-06-17 MED ORDER — TAMSULOSIN HCL 0.4 MG PO CAPS: 0.4000 mg | ORAL_CAPSULE | Freq: Every day | ORAL | Status: AC

## 2014-06-17 MED ORDER — CEFAZOLIN SODIUM-DEXTROSE 2-3 GM-% IV SOLR
INTRAVENOUS | Status: AC
  Filled 2014-06-17: qty 50

## 2014-06-17 MED ORDER — OXYCODONE HCL 5 MG PO TABS
ORAL_TABLET | ORAL | Status: AC
  Filled 2014-06-17: qty 2

## 2014-06-17 MED ORDER — FENTANYL CITRATE (PF) 100 MCG/2ML IJ SOLN
INTRAMUSCULAR | Status: DC | PRN
  Administered 2014-06-17 (×4): 50 ug via INTRAVENOUS

## 2014-06-17 MED ORDER — BELLADONNA ALKALOIDS-OPIUM 16.2-60 MG RE SUPP
RECTAL | Status: AC
  Filled 2014-06-17: qty 1

## 2014-06-17 MED ORDER — DEXAMETHASONE SODIUM PHOSPHATE 10 MG/ML IJ SOLN
INTRAMUSCULAR | Status: DC | PRN
  Administered 2014-06-17: 10 mg via INTRAVENOUS

## 2014-06-17 MED ORDER — ACETAMINOPHEN 10 MG/ML IV SOLN
INTRAVENOUS | Status: DC | PRN
  Administered 2014-06-17: 1000 mg via INTRAVENOUS

## 2014-06-17 MED ORDER — FENTANYL CITRATE (PF) 100 MCG/2ML IJ SOLN
25.0000 ug | INTRAMUSCULAR | Status: DC | PRN
  Filled 2014-06-17: qty 1

## 2014-06-17 MED ORDER — STERILE WATER FOR IRRIGATION IR SOLN
Status: DC | PRN
  Administered 2014-06-17: 3000 mL

## 2014-06-17 MED ORDER — LIDOCAINE HCL (CARDIAC) 20 MG/ML IV SOLN
INTRAVENOUS | Status: DC | PRN
  Administered 2014-06-17: 100 mg via INTRAVENOUS

## 2014-06-17 MED ORDER — FENTANYL CITRATE (PF) 100 MCG/2ML IJ SOLN
INTRAMUSCULAR | Status: AC
  Filled 2014-06-17: qty 4

## 2014-06-17 MED ORDER — SODIUM CHLORIDE 0.9 % IV SOLN
INTRAVENOUS | Status: DC
  Administered 2014-06-17 (×2): via INTRAVENOUS
  Filled 2014-06-17: qty 1000

## 2014-06-17 MED ORDER — CEFAZOLIN SODIUM-DEXTROSE 2-3 GM-% IV SOLR
2.0000 g | INTRAVENOUS | Status: AC
  Administered 2014-06-17: 2 g via INTRAVENOUS
  Filled 2014-06-17: qty 50

## 2014-06-17 SURGICAL SUPPLY — 40 items
BAG DRN ANRFLXCHMBR STRAP LEK (BAG)
BAG URINE DRAINAGE (UROLOGICAL SUPPLIES) IMPLANT
BAG URINE LEG 19OZ MD ST LTX (BAG) IMPLANT
BLADE CLIPPER SURG (BLADE) ×4 IMPLANT
BNDG CONFORM 3 STRL LF (GAUZE/BANDAGES/DRESSINGS) IMPLANT
BOOTIES KNEE HIGH SLOAN (MISCELLANEOUS) ×4 IMPLANT
CANISTER SUCTION 2500CC (MISCELLANEOUS) IMPLANT
CATH FOLEY 2WAY SLVR  5CC 18FR (CATHETERS) ×2
CATH FOLEY 2WAY SLVR 5CC 18FR (CATHETERS) ×2 IMPLANT
CHARGE TECH PROCEDURE ONCURA (LABOR (TRAVEL & OVERTIME)) ×4 IMPLANT
CLOTH BEACON ORANGE TIMEOUT ST (SAFETY) ×4 IMPLANT
COVER BACK TABLE 60X90IN (DRAPES) ×4 IMPLANT
COVER MAYO STAND STRL (DRAPES) ×4 IMPLANT
DRAPE INCISE IOBAN 66X45 STRL (DRAPES) ×4 IMPLANT
DRAPE UNDERBUTTOCKS STRL (DRAPE) ×4 IMPLANT
DRSG TEGADERM 4X4.75 (GAUZE/BANDAGES/DRESSINGS) ×4 IMPLANT
DRSG TEGADERM 8X12 (GAUZE/BANDAGES/DRESSINGS) ×4 IMPLANT
GAS ARGON HIGH PRESSURE (MEDICAL GASES) ×4 IMPLANT
GAS HELIUM HIGH PRESSURE (MEDICAL GASES) ×4 IMPLANT
GAUZE SPONGE 4X4 12PLY STRL (GAUZE/BANDAGES/DRESSINGS) IMPLANT
GLOVE BIO SURGEON STRL SZ 6.5 (GLOVE) ×1 IMPLANT
GLOVE BIO SURGEON STRL SZ7.5 (GLOVE) ×4 IMPLANT
GLOVE BIO SURGEONS STRL SZ 6.5 (GLOVE) ×1
GLOVE BIOGEL PI IND STRL 6.5 (GLOVE) IMPLANT
GLOVE BIOGEL PI INDICATOR 6.5 (GLOVE) ×2
GOWN STRL REUS W/TWL LRG LVL3 (GOWN DISPOSABLE) ×2 IMPLANT
GUIDEWIRE SUPER STIFF (WIRE) ×4 IMPLANT
HOLDER FOLEY CATH W/STRAP (MISCELLANEOUS) ×4 IMPLANT
KIT CRYO ENDOCARE (DISPOSABLE) ×2 IMPLANT
KIT ICE ROD PROCEDURE PRECISE (DISPOSABLE) IMPLANT
KIT PROSTATE PRESICE I (KITS) IMPLANT
NDL SPNL 18GX3.5 QUINCKE PK (NEEDLE) IMPLANT
NEEDLE SPNL 18GX3.5 QUINCKE PK (NEEDLE) IMPLANT
PACK CYSTO (CUSTOM PROCEDURE TRAY) ×4 IMPLANT
PLUG CATH AND CAP STER (CATHETERS) ×4 IMPLANT
SPONGE GAUZE 4X4 12PLY STER LF (GAUZE/BANDAGES/DRESSINGS) ×2 IMPLANT
SYRINGE 10CC LL (SYRINGE) IMPLANT
SYRINGE IRR TOOMEY STRL 70CC (SYRINGE) IMPLANT
UNDERPAD 30X30 INCONTINENT (UNDERPADS AND DIAPERS) ×4 IMPLANT
WATER STERILE IRR 500ML POUR (IV SOLUTION) ×4 IMPLANT

## 2014-06-17 NOTE — H&P (Signed)
Reason For Visit 8 week f/u   Active Problems Problems  1. 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 Prostate cancer (C61) 3. Rising PSA level (R97.2)  History of Present Illness    59 YO male returns today for an 8 week f/u after taking abx for an elevated PSA. He was last seen by Jimmey Ralph, NP on 01/26/14 for a yearly office visit. He was prescribed Cipro 500mg  BID x 10 days for rising PSA of 4.11 on 01/19/14.    Hx of prostate cancer found via biopsy in March 2014 for an elevated PSA. After exploring his options he elected to proceed with brachytherapy to treat his cancer. His seeds were implanted on 09/11/12.     Hx of rising PSA which increased from 2.71 to 3.92 between November 2012 and December 2013. He was seen by Dr. Gaynelle Arabian and his PSA was repeated and was 3.38. He also had a PCA-3 test performed which was 117 indicating a very high risk for prostate cancer. He underwent a prostate biopsy on 05/13/12 which confirmed Gleason 3+3=6 adenocarcinoma in 2 out of 12 biopsy cores. He has been thoroughly counseled by Dr. Gaynelle Arabian about his treatment options and has seen Dr. Valere Dross in radiation oncology as well. He has no family history of prostate cancer.    His medical comorbidities include a history of diabetes managed with metformin, hypertension, hypercholesterolemia, sleep apnea although he does not use CPAP, and hypothyroidism s/p radioactive iodine treatment in the past.     TNM stage: cT1c Nx Mx  PSA: 3.38  Gleason score: 3+3=6  Biopsy (05/13/12): 2 out of 12 biopsy cores -- L lateral base (< 5%), R lateral mid (< 5%)  Prostate volume: 31.6 cc  PSAD: 0.11    Nomogram  OC disease: 91%  EPE: 7%  SVI: 1%  LNI: 1.2%  PFS (surgery): 98% at 5 years, 98% at 10 years  DSS: 99% at 5 years, 99% at 10 years    Urinary function: He has minimal lower urinary tract symptoms. His most troublesome symptom is urinary urgency. IPSS is  9.  Erectile function: He has severe erectile dysfunction which has been resistant to therapy with PDE 5 inhibitors. SHIM score: 2.   Past Medical History Problems  1. History of diabetes mellitus (Z86.39) 2. History of hypercholesterolemia (Z86.39) 3. History of hypertension (Z86.79) 4. History of sleep apnea (Z87.09) 5. History of Hyperthyroidism (E05.90)  Surgical History Problems  1. History of Donor Nephrectomy 2. History of Knee Surgery 3. History of Surgery Prostate Transperineal Placement Of Needles  Current Meds 1. Amitriptyline HCl - 75 MG Oral Tablet;  Therapy: (Recorded:15Jan2014) to Recorded 2. AmLODIPine Besylate 10 MG Oral Tablet;  Therapy: [phone] to Recorded 3. Aspirin 81 MG Oral Tablet;  Therapy: (Recorded:15Jan2014) to Recorded 4. Chlorthalidone 25 MG Oral Tablet;  Therapy: [id number] to Recorded 5. Ciprofloxacin HCl - 500 MG Oral Tablet; Take 1 tablet twice daily;  Therapy: [id number] to (Last Rx:01Dec2015)  Requested for: [id number]  Ordered 6. Crestor 20 MG Oral Tablet;  Therapy: [id number] to Recorded 7. Glimepiride 2 MG Oral Tablet;  Therapy: (Recorded:01Dec2015) to Recorded 8. Synthroid TABS;  Therapy: (Recorded:29Apr2014) to Recorded  Allergies Medication  1. No Known Drug Allergies  Family History Problems  1. Denied: Family history of Family Health Status Number Of Children 2. Family history of Renal Failure : Father  Social History Problems  1. Denied: History of Alcohol Use 2. Family history of Death In The Family Father  43yrs, renal faliure 3. Family history of Death In The Family Mother   22LNL, heart complications 4. Marital History - Currently Married 5. History of Marital History - Single 6. Never A Smoker 7. Occupation:   Control and instrumentation engineer 8. Denied: History of Tobacco Use  Review of Systems Genitourinary, constitutional, skin, eye, otolaryngeal, hematologic/lymphatic, cardiovascular, pulmonary, endocrine, musculoskeletal,  gastrointestinal, neurological and psychiatric system(s) were reviewed and pertinent findings if present are noted and are otherwise negative.  Genitourinary: feelings of urinary urgency.    Vitals Vital Signs [Data Includes: Last 1 Day]  Recorded: 25Jan2016 02:49PM  Blood Pressure: 155 / 96 Temperature: 99.3 F Heart Rate: 107  Physical Exam Constitutional: Well nourished and well developed . No acute distress. The patient appears well hydrated.  ENT:. The ears and nose are normal in appearance.  Neck: The appearance of the neck is normal.  Pulmonary: No respiratory distress and normal respiratory rhythm and effort.  Cardiovascular: Heart rate and rhythm are normal . No peripheral edema.  Abdomen: The abdomen is mildly obese. The abdomen is soft and nontender. No masses are palpated. No CVA tenderness. No hernias are palpable. No hepatosplenomegaly noted.  Rectal: Rectal exam demonstrates normal sphincter tone, the anus is normal on inspection., no tenderness, no masses and no residual hemorrhoidal skin tags seen. Estimated prostate size is 2+. Normal rectal tone, no rectal masses, prostate is smooth, symmetric and non-tender. The prostate has no nodularity, is not indurated and is not fluctuant. The left seminal vesicle is nonpalpable. The right seminal vesicle is nonpalpable. The perineum is normal on inspection.  Genitourinary: Examination of the penis demonstrates no discharge, no masses, no lesions and a normal meatus. The scrotum is edematous, but normal in appearance and without lesions. The right epididymis is palpably normal and non-tender. The left epididymis is palpably normal and non-tender. The right testis is palpably normal, non-tender and without masses. The left testis is non-tender and without masses.  Skin: Normal skin turgor, no visible rash and no visible skin lesions.  Neuro/Psych:. Mood and affect are appropriate.    Results/Data Urine [Data Includes: Last 1 Day]    [id number]  COLOR YELLOW   APPEARANCE CLEAR   SPECIFIC GRAVITY 1.015   pH 6.5   GLUCOSE NEG mg/dL  BILIRUBIN NEG   KETONE NEG mg/dL  BLOOD NEG   PROTEIN NEG mg/dL  UROBILINOGEN 1 mg/dL  NITRITE NEG   LEUKOCYTE ESTERASE NEG   Selected Results  PSA [id number] 03:33PM Carolan Clines  SPECIMEN TYPE: BLOOD   Test Name Result Flag Reference  PSA 3.64 ng/mL  <=4.00  RESULT REPEATED AND VERIFIED. TEST METHODOLOGY: ECLIA PSA (ELECTROCHEMILUMINESCENCE IMMUNOASSAY)   Assessment Assessed  1. 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 Prostate cancer (C61) 3. Rising PSA level (R97.2)  59 yo hypertensive, diabetic AA male with recurrent T1c caP following I-125 seed Rx for CaP. Prostate size was 31.58cc gland. CaP G 3+3=6 in 2 areas, 5%, and 3 areas of pre-cancer. He has had 3 abnormal psa since follow-up, and I have advised him to have cryotherapy for persistent CaP for T1c CaP.    Discussed with pt and wife. Should not have problem with incontinence.   Plan Health Maintenance  1. UA With REFLEX; [Do Not Release]; Status:Resulted - Requires  Verification;   Done: [id number] 02:36PM  PUS for size.   Cryotherapy for prostate cancer recurrence.   Pt will discuss cost with insurance department and monthly payment.   Discussion/Summary cc: Dr. Janeice Robinson  Signatures Electronically signed by : Carolan Clines, M.D.; Mar 22 2014  3:28PM EST

## 2014-06-17 NOTE — Anesthesia Procedure Notes (Signed)
Procedure Name: LMA Insertion Date/Time: 06/17/2014 9:46 AM Performed by: Wanita Chamberlain Pre-anesthesia Checklist: Patient identified, Emergency Drugs available, Suction available, Patient being monitored and Timeout performed Patient Re-evaluated:Patient Re-evaluated prior to inductionOxygen Delivery Method: Circle system utilized Preoxygenation: Pre-oxygenation with 100% oxygen Intubation Type: IV induction Ventilation: Mask ventilation without difficulty LMA: LMA inserted LMA Size: 5.0 Number of attempts: 1 Airway Equipment and Method: Bite block Placement Confirmation: positive ETCO2 Tube secured with: Tape

## 2014-06-17 NOTE — Anesthesia Postprocedure Evaluation (Signed)
  Anesthesia Post-op Note  Patient: Keith Maldonado  Procedure(s) Performed: Procedure(s) (LRB): CRYO ABLATION PROSTATE (N/A) CYSTOSCOPY FLEXIBLE (N/A)  Patient Location: PACU  Anesthesia Type: General  Level of Consciousness: awake and alert   Airway and Oxygen Therapy: Patient Spontanous Breathing  Post-op Pain: mild  Post-op Assessment: Post-op Vital signs reviewed, Patient's Cardiovascular Status Stable, Respiratory Function Stable, Patent Airway and No signs of Nausea or vomiting  Last Vitals:  Filed Vitals:   06/17/14 1215  BP: 118/64  Pulse: 80  Temp:   Resp: 15    Post-op Vital Signs: stable   Complications: No apparent anesthesia complications

## 2014-06-17 NOTE — Discharge Instructions (Addendum)
INSTRUCTIONS AFTER CRYOABLATION OF THE PROSTATE  Normal Findings After Cryoablation:   You may experience a number of symptoms after the cryoablation which occur in some patients.  There is no cause for alarm should this happen.  These symptoms include:  -Blood in the urine.  When you are discharged after your surgery, your urine will have some blood in it and may appear red.  This occurs to some degree in all patients after cryoablation and is not cause for alarm.  Your urine should clear approximately 24 hours after the procedure, but may persist for quite a while longer.  -Scrotal and penile swelling and bruising.  This occurs about 2-3 days after the cryoablation and is caused by tissue swelling which temporarily blocks the drainage of lymph.  This is painless and resolves in less than one week.  Ice packs and lying down for short periods of time during the day will improve the swelling.  -Small amounts of bloody discharge from the end of your penis.  This can occur for up to 6 weeks after the procedure and is not cause for alarm.  It is due  to some discharge from the urethra in the area of the prostate.  -Some numbness in the head of the penis.  Occasionally, when a large amount of freezing has been performed during the procedure, the nerve which supplies sensation to one or both Reffner of the head of the penis may be affected. The sensation returns after a number of months.  Call your doctor at [phone] if any of the following occur:               -If you have any pain, fever, or chills.  -If your foley catheter or suprapubic tube is not draining urine.  -If there is decreasing urinary stream.  This may indicate sloughing of some  dead tissue in the area of the prostate near the opening to the bladder.  It may clear on its own but,  if the problem becomes severe, it may require removal of the dead tissue through a cystoscope.  -If you have diarrhea after urination or foul-smelling urine.   These symptoms may indicate an urethrorectal fistula, which is a hole between the bladder channel and the rectum.  This should be investigated by your doctor.  -If you have any questions or problems.   Diet:  Resume your normal diet.   If you become constipated, you may try over-the-counter remedies such as Milk of Magnesia.  If you are nauseated, vomiting or feel bloated, notify your doctor's office.  DO NOT GIVE YOURSELF ANY ENEMAS OR RECTAL MEDICATIONS.  THE RECTAL WALL IS THIN AFTER CRYOABLATION.  Activity:   -You may have swelling and bruising of the penis and scrotum.  Apply ice packs to the area behind the scrotum intermittently for 24-48 hours after your surgery to keep the swelling down.  Wearing an athletic supporter (jock strap) might also help.  -Lying flat on your back will also help decrease the swelling.  You may find when you are sitting up or walking around that the swelling increases.   -You will have  puncture wounds behind your scrotum making it painful to sit down.  Use a hemorrhoid donut to sit on if needed.  -You may shower on the second day after surgery.  -There are no lifting or driving restrictions.  Use caution while the suprapubic tube  is in place.   Medications:  -You may resume your preoperative medications,  except aspirin and other blood thinning agents.  Unless otherwise instructed, resume your aspirin after your suprapubic tube is removed.  If you are taking Coumadin or other blood thinners, please discuss when to restart these medications with your surgeon.  -Unless otherwise ordered, you will be given prescriptions for the following medications which you will need to take after your procedure:   *Antibiotics -  to prevent infection while your suprapubic tube is in place.   *Anti-inflammatory - to prevent pain and to decrease the inflammation in the area of the prostate.  You may take ibuprofen (Motrin, Advil),  acetaminophen (Tylenol) or naproxen (Aleve) as  needed for discomfort.   Call your doctor's office at [phone] for an appointment in weeks.      Post Anesthesia Home Care Instructions  Activity: Get plenty of rest for the remainder of the day. A responsible adult should stay with you for 24 hours following the procedure.  For the next 24 hours, DO NOT: -Drive a car -Paediatric nurse -Drink alcoholic beverages -Take any medication unless instructed by your physician -Make any legal decisions or sign important papers.  Meals: Start with liquid foods such as gelatin or soup. Progress to regular foods as tolerated. Avoid greasy, spicy, heavy foods. If nausea and/or vomiting occur, drink only clear liquids until the nausea and/or vomiting subsides. Call your physician if vomiting continues.  Special Instructions/Symptoms: Your throat may feel dry or sore from the anesthesia or the breathing tube placed in your throat during surgery. If this causes discomfort, gargle with warm salt water. The discomfort should disappear within 24 hours.  If you had a scopolamine patch placed behind your ear for the management of post- operative nausea and/or vomiting:  1. The medication in the patch is effective for 72 hours, after which it should be removed.  Wrap patch in a tissue and discard in the trash. Wash hands thoroughly with soap and water. 2. You may remove the patch earlier than 72 hours if you experience unpleasant side effects which may include dry mouth, dizziness or visual disturbances. 3. Avoid touching the patch. Wash your hands with soap and water after contact with the patch.

## 2014-06-17 NOTE — Interval H&P Note (Signed)
History and Physical Interval Note:  06/17/2014 8:24 AM  Keith Maldonado  has presented today for surgery, with the diagnosis of PROSTATE CANCER  The various methods of treatment have been discussed with the patient and family. After consideration of risks, benefits and other options for treatment, the patient has consented to  Procedure(s): CRYO ABLATION PROSTATE (N/A) as a surgical intervention .  The patient's history has been reviewed, patient examined, no change in status, stable for surgery.  I have reviewed the patient's chart and labs.  Questions were answered to the patient's satisfaction.     Rileigh Kawashima I Lendon George

## 2014-06-17 NOTE — Transfer of Care (Signed)
Immediate Anesthesia Transfer of Care Note  Patient: Keith Maldonado  Procedure(s) Performed: Procedure(s): CRYO ABLATION PROSTATE (N/A) CYSTOSCOPY FLEXIBLE (N/A)  Patient Location: PACU  Anesthesia Type:General  Level of Consciousness: awake, alert , oriented and patient cooperative  Airway & Oxygen Therapy: Patient Spontanous Breathing and Patient connected to nasal cannula oxygen  Post-op Assessment: Report given to RN and Post -op Vital signs reviewed and stable  Post vital signs: Reviewed and stable  Last Vitals:  Filed Vitals:   06/17/14 0740  BP: 128/80  Pulse: 89  Temp: 37 C  Resp: 20    Complications: No apparent anesthesia complications

## 2014-06-17 NOTE — Op Note (Signed)
Pre-operative diagnosis :   Recurrent adenocarcinoma prostate  Postoperative diagnosis:  Same  Operation:  Cryotherapy of the prostate  Surgeon:  S. Gaynelle Arabian, MD  First assistant:  None  Anesthesia:  {LMA  Preparation:  After appropriate preanesthesia the patient is brought to the operative room, placed on the operating table in the dorsal supine position where general LMA anesthesia was introduced. The armband was double checked. The history was reviewed.  Review history:  . 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 Prostate cancer (C61) 3. Rising PSA level (R97.2)  History of Present Illness   59 YO male returns today for an 8 week f/u after taking abx for an elevated PSA. He was last seen by Jimmey Ralph, NP on 01/26/14 for a yearly office visit. He was prescribed Cipro 500mg  BID x 10 days for rising PSA of 4.11 on 01/19/14.    Hx of prostate cancer found via biopsy in March 2014 for an elevated PSA. After exploring his options he elected to proceed with brachytherapy to treat his cancer. His seeds were implanted on 09/11/12.     Statement of  Likelihood of Success: Excellent. TIME-OUT observed.:  Procedure:  Examination showed bilateral descended testicles. The penis is uncircumcised. The penis, perineum, and scrotum were washed with Betadine solution, prepped with Betadine solution and draped in usual fashion. Particular attention was paid to draping the testicles up and out of the way using a plastic Vi-Drape, leaving the perineum open for ice rod placement.  Foley catheter was inserted in the bladder, with 10 mL of sterile water in the balloon. Transrectal ultrasound was accomplished, showing a 23 mL gland, with multiple seeds present from prior iodine seed therapy.  Using the [employer] cryotherapy equipment, multiple variable length ice rods were placed. Additional thermal probes were placed into an IV a's fascia, and over the rectal hump, and the  perirectal fascia. Flexible cystoscopy was accomplished after removing the Foley catheter, and showing no evidence of rods within the urethra or the bladder. A guidewire was placed through the scope and coiled in the bladder. The scope was removed. A urethral warming device was then passed over the wire and into the bladder, and the wire was removed. The urethral warming device was left in place for the entire procedure, and 20 minutes after the procedure was over.  Double freeze/thaw technique was then used with care taken to protect the urethra, and the rectum. The total freeze time was 7 minutes, and active thaw time was 10 minutes. The patient underwent active thaw with argon freezing and helium thaw on the first cycle, and active freeze with the argon but passive thaw on the second cycle.  Following the second fall, the ice rods were removed. Following removal of the urethral warming device, a Foley catheter was placed, 16 [place], with 10 mL in the balloon. The patient was given IV Toradol and IV Tylenol, as well as antibiotic coverage. He was awakened, and taken to recovery room in good condition.

## 2014-06-17 NOTE — Anesthesia Preprocedure Evaluation (Addendum)
Anesthesia Evaluation  Patient identified by MRN, date of birth, ID band Patient awake    Reviewed: Allergy & Precautions, H&P , NPO status , Patient's Chart, lab work & pertinent test results, reviewed documented beta blocker date and time   Airway Mallampati: III  TM Distance: >3 FB Neck ROM: Full    Dental  (+) Dental Advisory Given, Missing Missing upper front 2 teeth:   Pulmonary sleep apnea ,  Stop bang 7 breath sounds clear to auscultation  Pulmonary exam normal       Cardiovascular hypertension, Pt. on home beta blockers and Pt. on medications negative cardio ROS  Rhythm:Regular Rate:Normal     Neuro/Psych negative neurological ROS  negative psych ROS   GI/Hepatic negative GI ROS, Neg liver ROS,   Endo/Other  diabetes, Well Controlled, Type 2, Oral Hypoglycemic AgentsHypothyroidism Morbid obesity  Renal/GU negative Renal ROS  negative genitourinary   Musculoskeletal negative musculoskeletal ROS (+) Arthritis -, Osteoarthritis,    Abdominal   Peds negative pediatric ROS (+)  Hematology negative hematology ROS (+)   Anesthesia Other Findings   Reproductive/Obstetrics negative OB ROS                           Anesthesia Physical Anesthesia Plan  ASA: III  Anesthesia Plan: General   Post-op Pain Management:    Induction: Intravenous  Airway Management Planned: LMA  Additional Equipment:   Intra-op Plan:   Post-operative Plan:   Informed Consent:   Plan Discussed with: Surgeon  Anesthesia Plan Comments:         Anesthesia Quick Evaluation

## 2014-06-18 ENCOUNTER — Encounter (HOSPITAL_BASED_OUTPATIENT_CLINIC_OR_DEPARTMENT_OTHER): Payer: Self-pay | Admitting: Urology

## 2014-11-23 ENCOUNTER — Encounter (HOSPITAL_COMMUNITY): Payer: Self-pay | Admitting: *Deleted

## 2014-11-23 ENCOUNTER — Emergency Department (HOSPITAL_COMMUNITY)
Admission: EM | Admit: 2014-11-23 | Discharge: 2014-11-23 | Disposition: A | Discharge status: Home / Self Care | Payer: BC Managed Care – PPO | Source: Home / Self Care | Attending: Family Medicine | Admitting: Family Medicine

## 2014-11-23 DIAGNOSIS — M1000 Idiopathic gout, unspecified site: Secondary | ICD-10-CM

## 2014-11-23 MED ORDER — INDOMETHACIN 50 MG PO CAPS: 50.0000 mg | ORAL_CAPSULE | Freq: Three times a day (TID) | ORAL | Status: AC

## 2014-11-23 NOTE — ED Notes (Signed)
Pt  Reports  r  Foot  Pain     X  1  Week    Pt  denys  Any  Injury

## 2014-11-23 NOTE — ED Provider Notes (Signed)
CSN: [id number]     Arrival date & time 11/23/14  1305 History   First MD Initiated Contact with Patient 11/23/14 1343     Chief Complaint  Patient presents with  . Foot Pain   (Consider location/radiation/quality/duration/timing/severity/associated sxs/prior Treatment) Patient is a 59 y.o. male presenting with lower extremity pain. The history is provided by the patient.  Foot Pain This is a new problem. The current episode started more than 2 days ago. The problem occurs constantly. The problem has been gradually worsening. Nothing aggravates the symptoms. He has tried acetaminophen for the symptoms.   is a 59 year old man who has developed right great toe pain at the MTP joint over the last week. It steadily gotten worse and now he has pain with even light touch. He has no history of this. He denies a history of gout.  Past Medical History  Diagnosis Date  . Hypertension   . Diabetes mellitus, type 2   . Erectile dysfunction   . Hypothyroidism   . H/O radioactive iodine thyroid ablation   . Hyperlipidemia   . Arthritis     knees  . Prostate cancer urologist-  dr Gaynelle Arabian    dx 2014--  cT1c Nx Mx, Gleason 3+3=6, prostate volume 31.58 cc, PSA 3.38  s/p radioactive seed implants 09-11-2012  . 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 Wears glasses   . Wears dentures     upper  . Lower urinary tract symptoms (LUTS)   . At risk for sleep apnea     STOP-BANG= 7       SENT TO PCP 06-14-2014   Past Surgical History  Procedure Laterality Date  . Prostate biopsy  05/13/2012     adenocarcinoma  . Knee arthroscopy Left 2007  . Kidney donation Left 1999  . Radioactive seed implant N/A 09/11/2012    Procedure: RADIOACTIVE SEED IMPLANT;  Surgeon: Ailene Rud, MD;  Location: [facility];  Service: Urology;  Laterality: N/A;     seeds implanted no seeds found in bladder  . Cryoablation N/A 06/17/2014    Procedure: CRYO  ABLATION PROSTATE;  Surgeon: Carolan Clines, MD;  Location: [facility];  Service: Urology;  Laterality: N/A;  . Cystoscopy N/A 06/17/2014    Procedure: CYSTOSCOPY FLEXIBLE;  Surgeon: Carolan Clines, MD;  Location: [facility];  Service: Urology;  Laterality: N/A;   Family History  Problem Relation Age of Onset  . Diabetes Mother   . Heart failure Mother   . Heart failure Father   . Kidney failure Father    Social History  Substance Use Topics  . Smoking status: Never Smoker   . Smokeless tobacco: Never Used  . Alcohol Use: Yes     Comment: occasionally     Review of Systems  Constitutional: Negative.   HENT: Negative.   Eyes: Negative.   Respiratory: Negative.   Cardiovascular: Negative.   Gastrointestinal: Negative.   Genitourinary: Negative.   Musculoskeletal: Positive for joint swelling, arthralgias and gait problem.    Allergies  Review of patient's allergies indicates no known allergies.  Home Medications   Prior to Admission medications   Medication Sig Start Date End Date Taking? Authorizing Provider  amitriptyline (ELAVIL) 25 MG tablet Take 75 mg by mouth at bedtime.    Historical Provider, MD  amLODipine (NORVASC) 10 MG tablet Take 10 mg by mouth every morning.     Historical  Provider, MD  aspirin 81 MG tablet Take 81 mg by mouth daily.    Historical Provider, MD  carvedilol (COREG) 12.5 MG tablet Take 12.5 mg by mouth 2 (two) times daily with a meal.    Historical Provider, MD  chlorthalidone (HYGROTON) 25 MG tablet Take 25 mg by mouth every morning.     Historical Provider, MD  ciprofloxacin (CIPRO) 500 MG tablet Take 1 tablet (500 mg total) by mouth 2 (two) times daily. 06/17/14   Carolan Clines, MD  glimepiride (AMARYL) 4 MG tablet Take 4 mg by mouth daily with breakfast.    Historical Provider, MD  levothyroxine (SYNTHROID, LEVOTHROID) 125 MCG tablet Take 125 mcg by mouth daily before breakfast.     Historical  Provider, MD  potassium chloride (K-DUR) 10 MEQ tablet Take 10 mEq by mouth every morning.    Historical Provider, MD  rosuvastatin (CRESTOR) 20 MG tablet Take 20 mg by mouth every evening.     Historical Provider, MD  tamsulosin (FLOMAX) 0.4 MG CAPS capsule Take 1 capsule (0.4 mg total) by mouth daily. 06/17/14   Carolan Clines, MD  traMADol-acetaminophen (ULTRACET) 37.5-325 MG per tablet Take 1 tablet by mouth every 6 (six) hours as needed. 06/17/14   Carolan Clines, MD   Meds Ordered and Administered this Visit  Medications - No data to display  BP 133/86 mmHg  Pulse 102  Temp(Src) 98.6 F (37 C) (Oral)  Resp 16  SpO2 97% No data found.   Physical Exam  Constitutional: He is oriented to person, place, and time. He appears well-developed and well-nourished.  HENT:  Head: Normocephalic and atraumatic.  Right Ear: External ear normal.  Left Ear: External ear normal.  Mouth/Throat: Oropharynx is clear and moist.  Eyes: Conjunctivae are normal.  Neck: Normal range of motion. Neck supple.  Cardiovascular: Normal rate.   Pulmonary/Chest: Effort normal.  Abdominal: Soft.  Musculoskeletal:  Patient has a mildly swollen right MTP joint which is exquisitely tender to palpation. There is erythema in the region and he is reluctant to move the toe.  Neurological: He is alert and oriented to person, place, and time.  Skin: Skin is warm. There is erythema.  Psychiatric: He has a normal mood and affect. His behavior is normal.  Nursing note and vitals reviewed.   ED Course  Procedures (including critical care time)       MDM       ICD-9-CM ICD-10-CM   1. Idiopathic gout, unspecified chronicity, unspecified site 274.9 M10.00 indomethacin (INDOCIN) 50 MG capsule     Signed, Robyn Haber, MD     Robyn Haber, MD 11/23/14 [phone]

## 2014-11-23 NOTE — Discharge Instructions (Signed)
Follow-up with Dr. Mancel Bale who can check your uric acid level and decide if you need to be on any chronic medicine. Keep in mind that aspirin can sometimes precipitate a gout attack.  Gout Gout is an inflammatory arthritis caused by a buildup of uric acid crystals in the joints. Uric acid is a chemical that is normally present in the blood. When the level of uric acid in the blood is too high it can form crystals that deposit in your joints and tissues. This causes joint redness, soreness, and swelling (inflammation). Repeat attacks are common. Over time, uric acid crystals can form into masses (tophi) near a joint, destroying bone and causing disfigurement. Gout is treatable and often preventable. CAUSES  The disease begins with elevated levels of uric acid in the blood. Uric acid is produced by your body when it breaks down a naturally found substance called purines. Certain foods you eat, such as meats and fish, contain high amounts of purines. Causes of an elevated uric acid level include:  Being passed down from parent to child (heredity).  Diseases that cause increased uric acid production (such as obesity, psoriasis, and certain cancers).  Excessive alcohol use.  Diet, especially diets rich in meat and seafood.  Medicines, including certain cancer-fighting medicines (chemotherapy), water pills (diuretics), and aspirin.  Chronic kidney disease. The kidneys are no longer able to remove uric acid well.  Problems with metabolism. Conditions strongly associated with gout include:  Obesity.  High blood pressure.  High cholesterol.  Diabetes. Not everyone with elevated uric acid levels gets gout. It is not understood why some people get gout and others do not. Surgery, joint injury, and eating too much of certain foods are some of the factors that can lead to gout attacks. SYMPTOMS   An attack of gout comes on quickly. It causes intense pain with redness, swelling, and warmth in a  joint.  Fever can occur.  Often, only one joint is involved. Certain joints are more commonly involved:  Base of the big toe.  Knee.  Ankle.  Wrist.  Finger. Without treatment, an attack usually goes away in a few days to weeks. Between attacks, you usually will not have symptoms, which is different from many other forms of arthritis. DIAGNOSIS  Your caregiver will suspect gout based on your symptoms and exam. In some cases, tests may be recommended. The tests may include:  Blood tests.  Urine tests.  X-rays.  Joint fluid exam. This exam requires a needle to remove fluid from the joint (arthrocentesis). Using a microscope, gout is confirmed when uric acid crystals are seen in the joint fluid. TREATMENT  There are two phases to gout treatment: treating the sudden onset (acute) attack and preventing attacks (prophylaxis).  Treatment of an Acute Attack.  Medicines are used. These include anti-inflammatory medicines or steroid medicines.  An injection of steroid medicine into the affected joint is sometimes necessary.  The painful joint is rested. Movement can worsen the arthritis.  You may use warm or cold treatments on painful joints, depending which works best for you.  Treatment to Prevent Attacks.  If you suffer from frequent gout attacks, your caregiver may advise preventive medicine. These medicines are started after the acute attack subsides. These medicines either help your kidneys eliminate uric acid from your body or decrease your uric acid production. You may need to stay on these medicines for a very long time.  The early phase of treatment with preventive medicine can be associated with  an increase in acute gout attacks. For this reason, during the first few months of treatment, your caregiver may also advise you to take medicines usually used for acute gout treatment. Be sure you understand your caregiver's directions. Your caregiver may make several adjustments  to your medicine dose before these medicines are effective.  Discuss dietary treatment with your caregiver or dietitian. Alcohol and drinks high in sugar and fructose and foods such as meat, poultry, and seafood can increase uric acid levels. Your caregiver or dietitian can advise you on drinks and foods that should be limited. HOME CARE INSTRUCTIONS   Do not take aspirin to relieve pain. This raises uric acid levels.  Only take over-the-counter or prescription medicines for pain, discomfort, or fever as directed by your caregiver.  Rest the joint as much as possible. When in bed, keep sheets and blankets off painful areas.  Keep the affected joint raised (elevated).  Apply warm or cold treatments to painful joints. Use of warm or cold treatments depends on which works best for you.  Use crutches if the painful joint is in your leg.  Drink enough fluids to keep your urine clear or pale yellow. This helps your body get rid of uric acid. Limit alcohol, sugary drinks, and fructose drinks.  Follow your dietary instructions. Pay careful attention to the amount of protein you eat. Your daily diet should emphasize fruits, vegetables, whole grains, and fat-free or low-fat milk products. Discuss the use of coffee, vitamin C, and cherries with your caregiver or dietitian. These may be helpful in lowering uric acid levels.  Maintain a healthy body weight. SEEK MEDICAL CARE IF:   You develop diarrhea, vomiting, or any side effects from medicines.  You do not feel better in 24 hours, or you are getting worse. SEEK IMMEDIATE MEDICAL CARE IF:   Your joint becomes suddenly more tender, and you have chills or a fever. MAKE SURE YOU:   Understand these instructions.  Will watch your condition.  Will get help right away if you are not doing well or get worse. Document Released: 02/10/2000 Document Revised: 06/29/2013 Document Reviewed: 09/26/2011 [facility] Patient Information 2015 [place], [place].  This information is not intended to replace advice given to you by your health care provider. Make sure you discuss any questions you have with your health care provider.

## 2017-08-20 ENCOUNTER — Encounter: Payer: Self-pay | Admitting: Genetic Counselor

## 2017-08-21 ENCOUNTER — Inpatient Hospital Stay: Payer: BC Managed Care – PPO | Attending: Genetic Counselor | Admitting: Genetic Counselor

## 2017-08-21 ENCOUNTER — Inpatient Hospital Stay: Payer: BC Managed Care – PPO

## 2017-08-21 ENCOUNTER — Encounter: Payer: Self-pay | Admitting: Genetic Counselor

## 2017-08-21 DIAGNOSIS — Z808 Family history of malignant neoplasm of other organs or systems: Secondary | ICD-10-CM | POA: Diagnosis not present

## 2017-08-21 DIAGNOSIS — Z803 Family history of malignant neoplasm of breast: Secondary | ICD-10-CM

## 2017-08-21 DIAGNOSIS — Z315 Encounter for genetic counseling: Secondary | ICD-10-CM

## 2017-08-21 DIAGNOSIS — C61 Malignant neoplasm of prostate: Secondary | ICD-10-CM | POA: Diagnosis not present

## 2017-08-21 DIAGNOSIS — Z8042 Family history of malignant neoplasm of prostate: Secondary | ICD-10-CM | POA: Diagnosis not present

## 2017-08-21 NOTE — Progress Notes (Signed)
REFERRING PROVIDER: Lorene Dy, MD [address], [place], [place] [address]  PRIMARY PROVIDER:  Lorene Dy, MD  PRIMARY REASON FOR VISIT:  1. Prostate cancer ([place])   2. Family history of breast cancer   3. Family history of prostate cancer      HISTORY OF PRESENT ILLNESS:   Keith Maldonado, a 62 y.o. male, was seen for a [place] cancer genetics consultation at the request of Dr. Mancel Maldonado due to a personal and family history of cancer.  Keith Maldonado presents to clinic today to discuss the possibility of a hereditary predisposition to cancer, genetic testing, and to further clarify his future cancer risks, as well as potential cancer risks for family members.   In 2014, at the age of 49, Keith Maldonado was diagnosed with Prostate cancer.  His Gleason score was 3+3=6.. This was treated with radiation seeds and freezing of cells.  He is currently being followed every 6 months.      CANCER HISTORY:   No history exists.     RISK FACTORS:  Colonoscopy: yes; 5 or less polyps.. Any excessive radiation exposure in the past:  No Dermatology: does not have regular skin exams   Past Medical History:  Diagnosis Date  . Acquired solitary kidney    donated left kidney 1999--  has solitary right kidney  . Arthritis    knees  . At risk for sleep apnea    STOP-BANG= 7       SENT TO PCP 06-14-2014  . Diabetes mellitus, type 2 ([place])   . Erectile dysfunction   . Family history of breast cancer   . Family history of prostate cancer   . H/O radioactive iodine thyroid ablation   . Hyperlipidemia   . Hypertension   . Hypothyroidism   . Lower urinary tract symptoms (LUTS)   . Prostate cancer [facility]) urologist-  dr Keith Maldonado   dx 2014--  cT1c Nx Mx, Gleason 3+3=6, prostate volume 31.58 cc, PSA 3.38  s/p radioactive seed implants 09-11-2012  . Wears dentures    upper  . Wears glasses     Past Surgical History:  Procedure Laterality Date  . CRYOABLATION N/A 06/17/2014   Procedure: CRYO  ABLATION PROSTATE;  Surgeon: Carolan Clines, MD;  Location: [facility];  Service: Urology;  Laterality: N/A;  . CYSTOSCOPY N/A 06/17/2014   Procedure: CYSTOSCOPY FLEXIBLE;  Surgeon: Carolan Clines, MD;  Location: [facility];  Service: Urology;  Laterality: N/A;  . KIDNEY DONATION Left 1999  . KNEE ARTHROSCOPY Left 2007  . PROSTATE BIOPSY  05/13/2012    adenocarcinoma  . RADIOACTIVE SEED IMPLANT N/A 09/11/2012   Procedure: RADIOACTIVE SEED IMPLANT;  Surgeon: Keith Rud, MD;  Location: [facility];  Service: Urology;  Laterality: N/A;     seeds implanted no seeds found in bladder    Social History   Socioeconomic History  . Marital status: Married    Spouse name: Not on file  . Number of children: Not on file  . Years of education: Not on file  . Highest education level: Not on file  Occupational History  . Not on file  Social Needs  . Financial resource strain: Not on file  . Food insecurity:    Worry: Not on file    Inability: Not on file  . Transportation needs:    Medical: Not on file    Non-medical: Not on file  Tobacco Use  . Smoking status: Never Smoker  .  Smokeless tobacco: Never Used  Substance and Sexual Activity  . Alcohol use: Yes    Comment: occasionally   . Drug use: Yes    Types: Marijuana    Comment: pt states only uses about twice a year   . Sexual activity: Not on file  Lifestyle  . Physical activity:    Days per week: Not on file    Minutes per session: Not on file  . Stress: Not on file  Relationships  . Social connections:    Talks on phone: Not on file    Gets together: Not on file    Attends religious service: Not on file    Active member of club or organization: Not on file    Attends meetings of clubs or organizations: Not on file    Relationship status: Not on file  Other Topics Concern  . Not on file  Social History Narrative  . Not on file     FAMILY HISTORY:  We  obtained a detailed, 4-generation family history.  Significant diagnoses are listed below: Family History  Problem Relation Age of Onset  . Diabetes Mother   . Heart failure Mother        d. 36  . Heart failure Father   . Kidney failure Father   . Bone cancer Father   . Breast cancer Sister 73       PALB2+  . Diabetes Maternal Grandmother   . Breast cancer Sister 69  . Breast cancer Sister 52  . Brain cancer Paternal Aunt   . Prostate cancer Paternal Uncle     The patient does not have children.  He has 8 sisters and five brothers.  Three sisters have been diagnosed with breast cancer, one of which was found to have a PALB2 mutation.  The patient's parents are both deceased.  His mother died of heart disease at 18.  She has 6-7 siblings who are cancer free.  The maternal grandparents are deceased from non cancer related issues.  The patient's father died from bone cancer.  He had three sisters and two brothers.  One brother had prostate cancer and one sister had brain cancer.  There is no other cancer listed on this side of the family.  Patient's maternal ancestors are of African American descent, and paternal ancestors are of African American descent. There is no reported Ashkenazi Jewish ancestry. There is no known consanguinity.  GENETIC COUNSELING ASSESSMENT: Keith Maldonado is a 62 y.o. 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 We, therefore, discussed and recommended the following at today's visit.   DISCUSSION: We discussed that his sister underwent genetic testing due to her diagnosis of breast cancer and the family history of breast and prostate cancer.  She was found to have a PALB2 mutation that we feel caused her breast cancer.  We discussed that her siblings have a 50% chance of also having this mutation.  Based on the patient's personal and  family history, we do not feel that we need to expand this testing further than the targeted PALB2 testing.  We reviewed the characteristics, features and inheritance patterns of hereditary cancer syndromes. We also discussed genetic testing, including the appropriate family members to test, the process of testing, insurance coverage and turn-around-time for results. We discussed the implications of a negative, positive and/or variant of uncertain significant result. We  recommended Keith Maldonado pursue genetic testing for the PALB2 gene panel.   Testing falls within 90 days of his sister's report date, and therefore the cost of testing is free, based on the laboratories policy.  PLAN: After considering the risks, benefits, and limitations, Keith Maldonado  provided informed consent to pursue genetic testing and the blood sample was sent to [facility] for analysis of the PALB2 targeted testing. Results should be available within approximately 2-3 weeks' time, at which point they will be disclosed by telephone to Keith Maldonado, as will any additional recommendations warranted by these results. Keith Maldonado will receive a summary of his genetic counseling visit and a copy of his results once available. This information will also be available in Epic. We encouraged Keith Maldonado to remain in contact with cancer genetics annually so that we can continuously update the family history and inform him of any changes in cancer genetics and testing that may be of benefit for his family. Keith Maldonado questions were answered to his satisfaction today. Our contact information was provided should additional questions or concerns arise.  Lastly, we encouraged Keith Maldonado to remain in contact with cancer genetics annually so that we can continuously update the family history and inform him of any changes in cancer genetics and testing that may be of benefit for this family.   Mr.  Maldonado questions were answered to his satisfaction today.  Our contact information was provided should additional questions or concerns arise. Thank you for the referral and allowing [place] to share in the care of your patient.   Karen P. Florene Glen, [place], [facility] Certified Genetic Counselor [email or web address] phone: [phone]  The patient was seen for a total of 30 minutes in face-to-face genetic counseling.  This patient was discussed with Drs. Magrinat, Lindi Adie and/or Burr Medico who agrees with the above.    _______________________________________________________________________ For Office Staff:  Number of people involved in session: 2 Was an Intern/ student involved with case: no

## 2017-09-02 ENCOUNTER — Encounter: Payer: Self-pay | Admitting: Genetic Counselor

## 2017-09-02 ENCOUNTER — Telehealth: Payer: Self-pay | Admitting: Genetic Counselor

## 2017-09-02 DIAGNOSIS — Z1379 Encounter for other screening for genetic and chromosomal anomalies: Secondary | ICD-10-CM | POA: Insufficient documentation

## 2017-09-02 NOTE — Telephone Encounter (Signed)
LM on VM with good news.  Asked that he CB. 

## 2017-09-03 ENCOUNTER — Ambulatory Visit: Payer: Self-pay | Admitting: Genetic Counselor

## 2017-09-03 DIAGNOSIS — Z1379 Encounter for other screening for genetic and chromosomal anomalies: Secondary | ICD-10-CM

## 2017-09-03 DIAGNOSIS — C61 Malignant neoplasm of prostate: Secondary | ICD-10-CM

## 2017-09-03 NOTE — Telephone Encounter (Signed)
Revealed that patient tested negative for the familial PALB2 variant.  He is not expected to be at increased risk for PALB2 cancers outside of general population risk.

## 2017-09-03 NOTE — Progress Notes (Signed)
HPI:  [name] was previously seen in the [place] clinic due to a personal and family history of cancer, a known PALB2 familial pathogenic variant, and concerns regarding a hereditary predisposition to cancer. Please refer to our prior cancer genetics clinic note for more information regarding [name] medical, social and family histories, and our assessment and recommendations, at the time. [name] recent genetic test results were disclosed to him, as were recommendations warranted by these results. These results and recommendations are discussed in more detail below.  CANCER HISTORY:    Prostate cancer ([place])   05/26/2012 Initial Diagnosis    Prostate cancer ([place])      08/28/2017 Genetic Testing    Negative genetic testing for the PALB2 familial variant.  The report date is August 28, 2017.       FAMILY HISTORY:  We obtained a detailed, 4-generation family history.  Significant diagnoses are listed below: Family History  Problem Relation Age of Onset  . Diabetes Mother   . Heart failure Mother        d. 25  . Heart failure Father   . Kidney failure Father   . Bone cancer Father   . Breast cancer Sister 2       PALB2+  . Diabetes Maternal Grandmother   . Breast cancer Sister 29  . Breast cancer Sister 49  . Brain cancer Paternal Aunt   . Prostate cancer Paternal Uncle     The patient does not have children.  He has 8 sisters and five brothers.  Three sisters have been diagnosed with breast cancer, one of which was found to have a PALB2 mutation.  The patient's parents are both deceased.  His mother died of heart disease at 43.  She has 6-7 siblings who are cancer free.  The maternal grandparents are deceased from non cancer related issues.  The patient's father died from bone cancer.  He had three sisters and two brothers.  One brother had prostate cancer and one sister had brain cancer.  There is no other cancer listed on this side of the  family.  Patient's maternal ancestors are of African American descent, and paternal ancestors are of African American descent. There is no reported Ashkenazi Jewish ancestry. There is no known consanguinity.  GENETIC TEST RESULTS: We recommended [name] pursue testing for the familial hereditary cancer gene mutation called PALB2, [id number]. [name] test was normal and did not reveal the familial mutation. We call this result a true negative result because the cancer-causing mutation was identified in [name] family, and he did not inherit it.  Given this negative result, [name] chances of developing PALB2-related cancers are the same as they are in the general population.     CANCER SCREENING RECOMMENDATIONS: This result is reassuring and indicates that [name] likely does not have an increased risk for a future cancer due to a mutation in one of these genes. This normal test also suggests that [name] cancer was most likely not due to an inherited predisposition associated with one of these genes.  Most cancers happen by chance and this negative test suggests that his cancer falls into this category.  We, therefore, recommended he continue to follow the cancer management and screening guidelines provided by his oncology and primary healthcare provider.   An individual's cancer risk and medical management are not determined by genetic test results alone. Overall cancer risk assessment incorporates additional factors, including personal medical history,  family history, and any available genetic information that may result in a personalized plan for cancer prevention and surveillance.  RECOMMENDATIONS FOR FAMILY MEMBERS:  Family member's risk for cancer is determined by their PALB2 deleterious variant status.  Women in this family might be at some increased risk of developing cancer, over the general population risk, simply due to the family history of cancer.  We recommended women  in this family have a yearly mammogram beginning at age 54, or 31 years younger than the earliest onset of cancer, an annual clinical breast exam, and perform monthly breast self-exams. Women in this family should also have a gynecological exam as recommended by their primary provider. All family members should have a colonoscopy by age 55.  Based on [name] family history, we recommended his siblings have genetic counseling and testing. [name] will let us know if we can be of any assistance in coordinating genetic counseling and/or testing for this family member.   FOLLOW-UP: Lastly, we discussed with [name] that cancer genetics is a rapidly advancing field and it is possible that new genetic tests will be appropriate for him and/or his family members in the future. We encouraged him to remain in contact with cancer genetics on an annual basis so we can update his personal and family histories and let him know of advances in cancer genetics that may benefit this family.   Our contact number was provided. [name] questions were answered to his satisfaction, and he knows he is welcome to call us at anytime with additional questions or concerns.   Roma Kayser, MS, [facility] Certified Genetic Counselor [email or web address]

## 2017-10-08 ENCOUNTER — Other Ambulatory Visit: Payer: Self-pay | Admitting: Internal Medicine

## 2017-10-08 DIAGNOSIS — R1010 Upper abdominal pain, unspecified: Secondary | ICD-10-CM

## 2017-10-11 ENCOUNTER — Ambulatory Visit
Admission: RE | Admit: 2017-10-11 | Discharge: 2017-10-11 | Disposition: A | Discharge status: Home / Self Care | Payer: BC Managed Care – PPO | Source: Ambulatory Visit | Attending: Internal Medicine | Admitting: Internal Medicine

## 2017-10-11 DIAGNOSIS — R1010 Upper abdominal pain, unspecified: Secondary | ICD-10-CM

## 2019-08-13 ENCOUNTER — Other Ambulatory Visit: Payer: Self-pay | Admitting: Internal Medicine

## 2019-08-13 ENCOUNTER — Ambulatory Visit
Admission: RE | Admit: 2019-08-13 | Discharge: 2019-08-13 | Disposition: A | Discharge status: Home / Self Care | Payer: BC Managed Care – PPO | Source: Ambulatory Visit | Attending: Internal Medicine | Admitting: Internal Medicine

## 2019-08-13 DIAGNOSIS — M5489 Other dorsalgia: Secondary | ICD-10-CM

## 2021-06-27 IMAGING — CR DG LUMBAR SPINE COMPLETE 4+V
5 series · 5 of 5 positions shown · non-contrast
Comparison: 09/24/2011.

CLINICAL DATA: Back pain.  No injury.

EXAM:
LUMBAR SPINE - COMPLETE 4+ VIEW

[t l-spine a.p.]
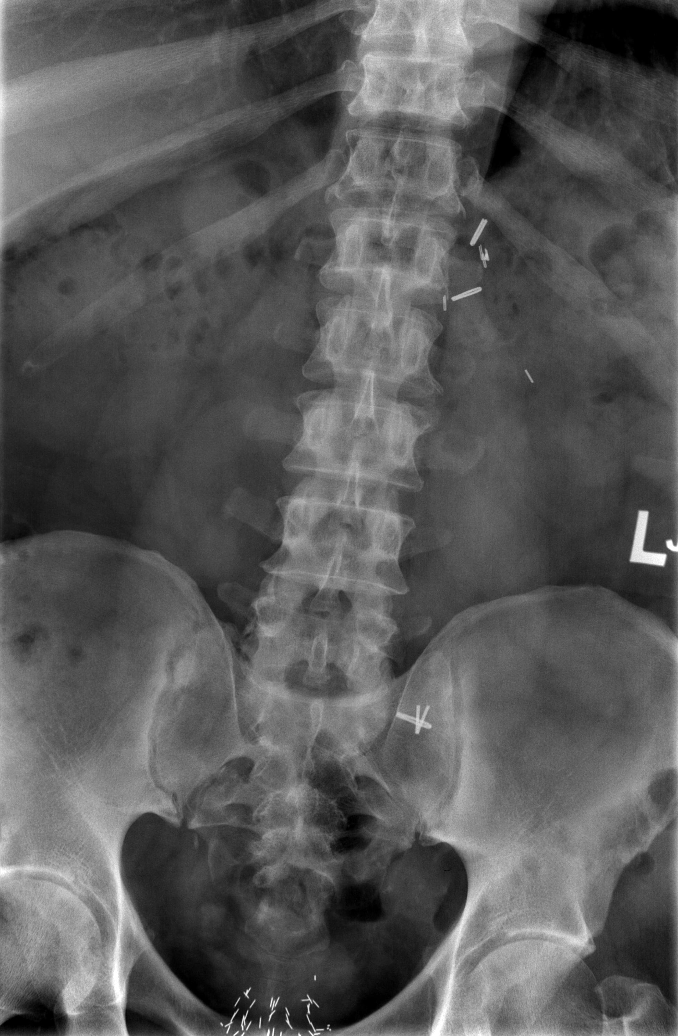

[t l-spine oblique exposure (1 of 2)]
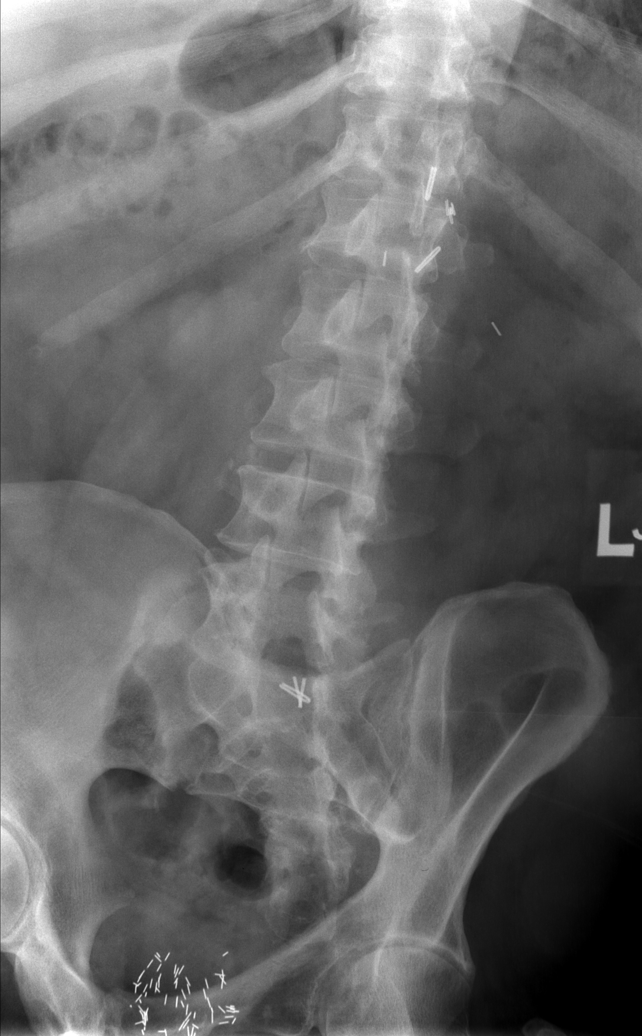

[t l-spine oblique exposure (2 of 2)]
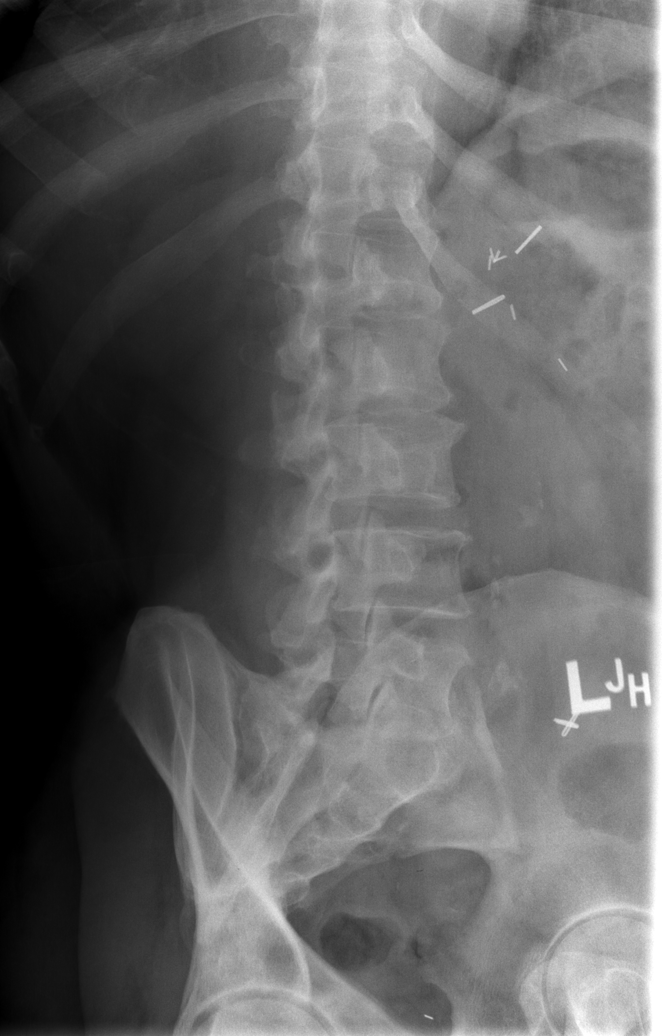

[t l-spine lat]
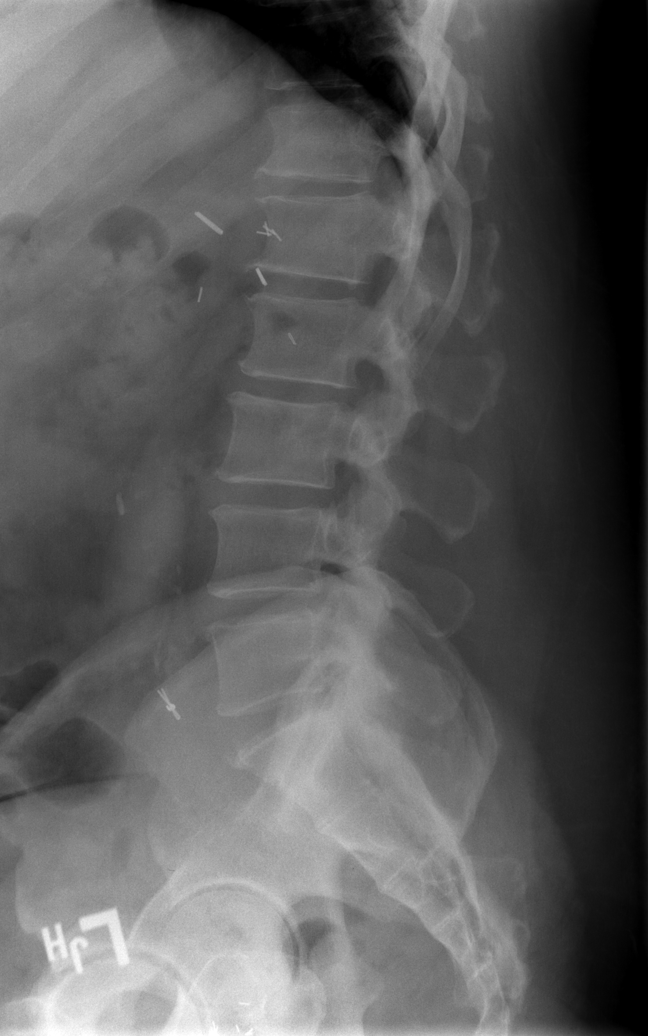

[t l-spine l5-s1 spot]
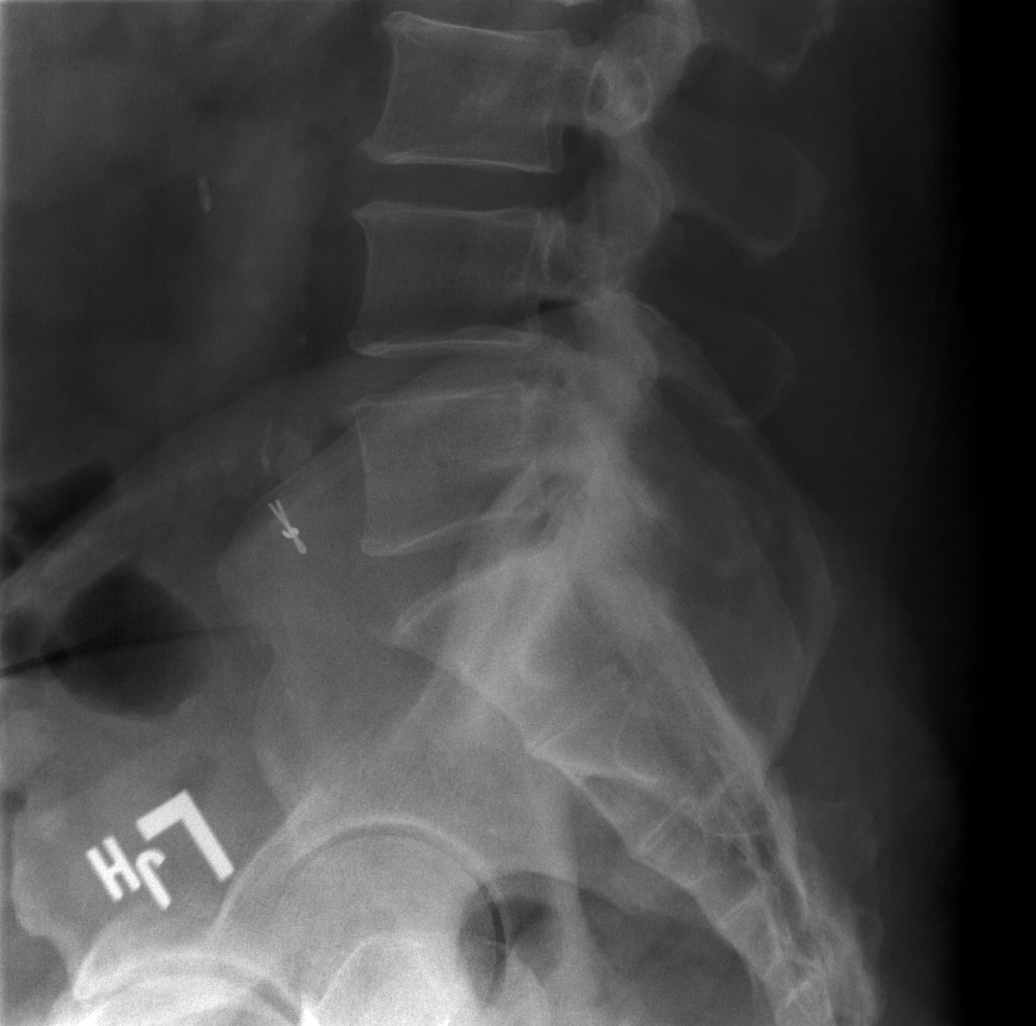

[5 of 5 positions shown; findings below may reference images not displayed]

FINDINGS: Surgical clips noted throughout the abdomen and pelvis. Implant
seeds noted in the prostate bed. No bowel distention. Diffuse mild
multilevel degenerative change lumbar spine, both SI joints, both
hips. No acute bony abnormality. Normal bony alignment. Aortic
atherosclerotic vascular calcification.
IMPRESSION: 1. Diffuse mild degenerative change lumbar spine, both SI joints,
both hips. No acute or focal bony abnormality.

2.  Prostate seeds noted.

3.  Aortoiliac atherosclerotic vascular disease.
